# Patient Record
Sex: Female | Born: 1970 | Race: Black or African American | Hispanic: No | Smoking: Never smoker
Health system: Southern US, Community
[De-identification: ages and names within clinical notes are randomized; demographics above are authoritative.]

## PROBLEM LIST (undated history)

## (undated) DIAGNOSIS — K219 Gastro-esophageal reflux disease without esophagitis: Secondary | ICD-10-CM

## (undated) DIAGNOSIS — I10 Essential (primary) hypertension: Secondary | ICD-10-CM

## (undated) HISTORY — DX: Essential (primary) hypertension: I10

## (undated) HISTORY — PX: TUBAL LIGATION: SHX77

---

## 2007-12-21 ENCOUNTER — Emergency Department (HOSPITAL_COMMUNITY): Admission: EM | Admit: 2007-12-21 | Discharge: 2007-12-21 | Payer: Self-pay | Admitting: Emergency Medicine

## 2009-03-30 ENCOUNTER — Emergency Department (HOSPITAL_COMMUNITY): Admission: EM | Admit: 2009-03-30 | Discharge: 2009-03-30 | Payer: Self-pay | Admitting: Emergency Medicine

## 2010-04-26 LAB — BASIC METABOLIC PANEL
CO2: 26 mEq/L (ref 19–32)
Calcium: 9 mg/dL (ref 8.4–10.5)
Chloride: 107 mEq/L (ref 96–112)
Glucose, Bld: 95 mg/dL (ref 70–99)
Sodium: 139 mEq/L (ref 135–145)

## 2010-04-26 LAB — URINALYSIS, ROUTINE W REFLEX MICROSCOPIC
Bilirubin Urine: NEGATIVE
Glucose, UA: NEGATIVE mg/dL
Hgb urine dipstick: NEGATIVE
Specific Gravity, Urine: 1.025 (ref 1.005–1.030)
Urobilinogen, UA: 0.2 mg/dL (ref 0.0–1.0)

## 2010-11-08 LAB — URINALYSIS, ROUTINE W REFLEX MICROSCOPIC
Hgb urine dipstick: NEGATIVE
Nitrite: NEGATIVE
Specific Gravity, Urine: >1.03 — ABNORMAL HIGH
Urobilinogen, UA: 0.2

## 2010-11-08 LAB — WET PREP, GENITAL

## 2010-11-08 LAB — PREGNANCY, URINE: Preg Test, Ur: NEGATIVE

## 2010-11-08 LAB — GC/CHLAMYDIA PROBE AMP, GENITAL: Chlamydia, DNA Probe: NEGATIVE

## 2012-03-15 ENCOUNTER — Emergency Department (HOSPITAL_COMMUNITY)
Admission: EM | Admit: 2012-03-15 | Discharge: 2012-03-15 | Disposition: A | Discharge status: Home / Self Care | Payer: BC Managed Care – PPO | Attending: Emergency Medicine | Admitting: Emergency Medicine

## 2012-03-15 ENCOUNTER — Encounter (HOSPITAL_COMMUNITY): Payer: Self-pay

## 2012-03-15 DIAGNOSIS — K089 Disorder of teeth and supporting structures, unspecified: Secondary | ICD-10-CM | POA: Insufficient documentation

## 2012-03-15 DIAGNOSIS — K0889 Other specified disorders of teeth and supporting structures: Secondary | ICD-10-CM

## 2012-03-15 DIAGNOSIS — Z8719 Personal history of other diseases of the digestive system: Secondary | ICD-10-CM | POA: Insufficient documentation

## 2012-03-15 HISTORY — DX: Gastro-esophageal reflux disease without esophagitis: K21.9

## 2012-03-15 MED ORDER — HYDROCODONE-ACETAMINOPHEN 5-325 MG PO TABS: ORAL_TABLET | ORAL | Status: DC

## 2012-03-15 MED ORDER — MELOXICAM 7.5 MG PO TABS: ORAL_TABLET | ORAL | Status: DC

## 2012-03-15 MED ORDER — HYDROCODONE-ACETAMINOPHEN 5-325 MG PO TABS
2.0000 | ORAL_TABLET | Freq: Once | ORAL | Status: AC
  Administered 2012-03-15: 1 via ORAL
  Filled 2012-03-15: qty 2

## 2012-03-15 MED ORDER — AMOXICILLIN 500 MG PO CAPS: 500.0000 mg | ORAL_CAPSULE | Freq: Three times a day (TID) | ORAL | Status: DC

## 2012-03-15 MED ORDER — AMOXICILLIN 250 MG PO CAPS
500.0000 mg | ORAL_CAPSULE | Freq: Once | ORAL | Status: AC
  Administered 2012-03-15: 500 mg via ORAL
  Filled 2012-03-15: qty 2

## 2012-03-15 MED ORDER — IBUPROFEN 800 MG PO TABS
800.0000 mg | ORAL_TABLET | Freq: Once | ORAL | Status: AC
  Administered 2012-03-15: 800 mg via ORAL
  Filled 2012-03-15: qty 1

## 2012-03-15 NOTE — ED Notes (Signed)
Multiple toothaches for 2 days. Right side of mouth

## 2012-03-15 NOTE — ED Notes (Signed)
Pt c/o dental pain that starts at her front two upper teeth and two upper back teeth, pain radiates around right ear and down right shoulder area, pt states that the teeth started bothering her about a year ago, pain is worse with cold liquids, can be made better by "swishing" with white vinegar but pain always returns, became worse two days ago

## 2012-03-15 NOTE — ED Provider Notes (Signed)
Medical screening examination/treatment/procedure(s) were performed by non-physician practitioner and as supervising physician I was immediately available for consultation/collaboration.   Carleene Cooper III, MD 03/15/12 [phone]

## 2012-03-15 NOTE — ED Provider Notes (Signed)
History     CSN: [id number]  Arrival date & time 03/15/12  1156   First MD Initiated Contact with Patient 03/15/12 1218      Chief Complaint  Patient presents with  . Dental Pain    (Consider location/radiation/quality/duration/timing/severity/associated sxs/prior treatment) Patient is a 42 y.o. female presenting with tooth pain. The history is provided by the patient.  Dental PainThe primary symptoms include mouth pain. Primary symptoms do not include shortness of breath or cough. The symptoms began 2 days ago. The symptoms are worsening. The symptoms occur constantly.  Additional symptoms include: gum swelling and jaw pain. Additional symptoms do not include: nosebleeds. Associated symptoms comments: Neck pain. Occasional SOB.. Medical issues do not include: alcohol problem and smoking.    Past Medical History  Diagnosis Date  . Acid reflux     Past Surgical History  Procedure Laterality Date  . Tubal ligation    . Cesarean section      No family history on file.  History  Substance Use Topics  . Smoking status: Never Smoker   . Smokeless tobacco: Not on file  . Alcohol Use: No    OB History   Grav Para Term Preterm Abortions TAB SAB Ect Mult Living                  Review of Systems  Constitutional: Negative for activity change.       All ROS Neg except as noted in HPI  HENT: Positive for dental problem. Negative for nosebleeds and neck pain.   Eyes: Negative for photophobia and discharge.  Respiratory: Negative for cough, shortness of breath and wheezing.   Cardiovascular: Negative for chest pain and palpitations.  Gastrointestinal: Negative for abdominal pain and blood in stool.  Genitourinary: Negative for dysuria, frequency and hematuria.  Musculoskeletal: Negative for back pain and arthralgias.  Skin: Negative.   Neurological: Negative for dizziness, seizures and speech difficulty.  Psychiatric/Behavioral: Negative for hallucinations and confusion.     Allergies  Review of patient's allergies indicates not on file.  Home Medications  No current outpatient prescriptions on file.  BP 156/73  Pulse 75  Temp(Src) 98.3 F (36.8 C) (Oral)  Ht 5\' 9"  (1.753 m)  Wt 173 lb (78.472 kg)  BMI 25.54 kg/m2  SpO2 100%  LMP 03/05/2012  Physical Exam  Nursing note and vitals reviewed. Constitutional: She is oriented to person, place, and time. She appears well-developed and well-nourished.  Non-toxic appearance.  HENT:  Head: Normocephalic.  Right Ear: Tympanic membrane and external ear normal.  Left Ear: Tympanic membrane and external ear normal.  Pain to palpation of the area of the right upper canine, right upper 2nd molar, and right lower 3rd molar. No abscess noted. Airway patent. No swelling under the tongue.  Eyes: EOM and lids are normal. Pupils are equal, round, and reactive to light.  Neck: Normal range of motion. Neck supple. Carotid bruit is not present.  Cardiovascular: Normal rate, regular rhythm, normal heart sounds, intact distal pulses and normal pulses.   Pulmonary/Chest: Breath sounds normal. No respiratory distress.  Abdominal: Soft. Bowel sounds are normal. There is no tenderness. There is no guarding.  Musculoskeletal: Normal range of motion.  Lymphadenopathy:       Head (right side): No submandibular adenopathy present.       Head (left side): No submandibular adenopathy present.    She has no cervical adenopathy.  Neurological: She is alert and oriented to person, place, and time. She has  normal strength. No cranial nerve deficit or sensory deficit.  Skin: Skin is warm and dry.  Psychiatric: She has a normal mood and affect. Her speech is normal.    ED Course  Procedures (including critical care time)  Labs Reviewed - No data to display No results found.  EKG 12:39  -  Rate 76. Rhythm - normal sinus with sinus arrhythmia. PR- normal. Biphasic P with possible LAE. Axis - normal. QRS - normal. No conduction  problems. ST normal. No STEMI. No old EDG for comparison. No diagnosis found.    MDM  I have reviewed nursing notes, vital signs, and all appropriate lab and imaging results for this patient. Pt presents with 2 days of increasing pain related to toothache. Plan Rx for amoxil, mobic, and norco. Pt to see a dentist as soon as possible.       Kathie Dike, PA 03/15/12 1231  Kathie Dike, [place] 03/15/12 1300

## 2012-07-28 ENCOUNTER — Encounter (HOSPITAL_COMMUNITY): Payer: Self-pay | Admitting: *Deleted

## 2012-07-28 ENCOUNTER — Emergency Department (HOSPITAL_COMMUNITY)
Admission: EM | Admit: 2012-07-28 | Discharge: 2012-07-28 | Disposition: A | Discharge status: Home / Self Care | Payer: BC Managed Care – PPO | Attending: Emergency Medicine | Admitting: Emergency Medicine

## 2012-07-28 ENCOUNTER — Emergency Department (HOSPITAL_COMMUNITY): Payer: BC Managed Care – PPO

## 2012-07-28 DIAGNOSIS — R05 Cough: Secondary | ICD-10-CM | POA: Insufficient documentation

## 2012-07-28 DIAGNOSIS — R059 Cough, unspecified: Secondary | ICD-10-CM | POA: Insufficient documentation

## 2012-07-28 DIAGNOSIS — R0789 Other chest pain: Secondary | ICD-10-CM

## 2012-07-28 DIAGNOSIS — Z79899 Other long term (current) drug therapy: Secondary | ICD-10-CM | POA: Insufficient documentation

## 2012-07-28 DIAGNOSIS — M549 Dorsalgia, unspecified: Secondary | ICD-10-CM | POA: Insufficient documentation

## 2012-07-28 DIAGNOSIS — Z8719 Personal history of other diseases of the digestive system: Secondary | ICD-10-CM | POA: Insufficient documentation

## 2012-07-28 DIAGNOSIS — R071 Chest pain on breathing: Secondary | ICD-10-CM | POA: Insufficient documentation

## 2012-07-28 MED ORDER — CYCLOBENZAPRINE HCL 10 MG PO TABS: 10.0000 mg | ORAL_TABLET | Freq: Two times a day (BID) | ORAL | Status: DC | PRN

## 2012-07-28 MED ORDER — KETOROLAC TROMETHAMINE 60 MG/2ML IM SOLN
60.0000 mg | Freq: Once | INTRAMUSCULAR | Status: AC
  Administered 2012-07-28: 60 mg via INTRAMUSCULAR
  Filled 2012-07-28: qty 2

## 2012-07-28 MED ORDER — NAPROXEN 500 MG PO TABS: 500.0000 mg | ORAL_TABLET | Freq: Two times a day (BID) | ORAL | Status: DC

## 2012-07-28 NOTE — ED Notes (Signed)
Pt and daughter given d/c instructions verbalized understanding of all, scripts given. Ambulated to lobby unaided.

## 2012-07-28 NOTE — ED Notes (Signed)
Pain rt breast and into back  Since 6/18, No injury,Increased pain with movement and deep breath.

## 2012-07-28 NOTE — ED Provider Notes (Signed)
History    CSN: [id number] Arrival date & time 07/28/12  1615  First MD Initiated Contact with Patient 07/28/12 1718     No chief complaint on file.  (Consider location/radiation/quality/duration/timing/severity/associated sxs/prior Treatment) Patient is a 42 y.o. female presenting with chest pain. The history is provided by the patient.  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) Duration:  5 days Timing:  Intermittent Progression:  Worsening Chronicity:  New Context: movement   Relieved by:  Nothing Worsened by:  Coughing, deep breathing and movement Ineffective treatments:  Aspirin Associated symptoms: back pain   Associated symptoms: no anorexia, no anxiety, no cough, no dizziness, no fever, no headache, no lower extremity edema, no nausea, no numbness, no shortness of breath, no syncope, not vomiting and no weakness   Risk factors: no coronary artery disease, no diabetes mellitus, no high cholesterol, no hypertension, no prior DVT/PE, no smoking and no surgery    Carolin H Lymon is a 42 y.o. female who presents to the ED with right breast pain. The pain started 5 days ago. She describes the pain as sharp shooting that increases with movement or taking a deep breath. She does a lot of lifting at work and the pain started while at work. 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 The pain radiates from her right chest to her upper back.   Past Medical History  Diagnosis Date  . Acid reflux    Past Surgical History  Procedure Laterality Date  . Tubal ligation    . Cesarean section     History reviewed. No pertinent family history. History  Substance Use Topics  . Smoking status: Never Smoker   . Smokeless tobacco: Not on file  . Alcohol Use: No   OB History   Grav Para  Term Preterm Abortions TAB SAB Ect Mult Living                 Review of Systems  Constitutional: Negative for fever and chills.  HENT: Negative for sore throat and neck pain.   Respiratory: Negative for cough and shortness of breath.   Cardiovascular: Positive for chest pain. Negative for syncope.  Gastrointestinal: Negative for nausea, vomiting and anorexia.  Musculoskeletal: Positive for back pain.  Skin: Negative for wound.  Neurological: Negative for dizziness, weakness, numbness and headaches.    Allergies  Review of patient's allergies indicates no known allergies.  Home Medications   Current Outpatient Rx  Name  Route  Sig  Dispense  Refill  . amoxicillin (AMOXIL) 500 MG capsule   Oral   Take 1 capsule (500 mg total) by mouth 3 (three) times daily.   21 capsule   0   . HYDROcodone-acetaminophen (NORCO/VICODIN) 5-325 MG per tablet      1 or 2 po q4h prn pain   20 tablet   0   . meloxicam (MOBIC) 7.5 MG tablet      1 po bid with food   12 tablet   0    BP 140/58  Pulse 78  Temp(Src) 98.1 F (36.7 C) (Oral)  Resp 18  Ht 5\' 9"  (1.753 m)  Wt 171 lb (77.565 kg)  BMI 25.24 kg/m2  SpO2 100%  LMP 07/13/2012 Physical Exam  Nursing note and  vitals reviewed. Constitutional: She is oriented to person, place, and time. She appears well-developed and well-nourished. No distress.  HENT:  Head: Normocephalic.  Eyes: Conjunctivae and EOM are normal.  Neck: Trachea normal and normal range of motion. Neck supple. No JVD present.  Cardiovascular: Normal rate, regular rhythm and normal heart sounds.   Pulmonary/Chest: Effort normal and breath sounds normal. She exhibits tenderness. She exhibits no mass. Right breast exhibits no inverted nipple, no mass and no nipple discharge.    There is pain with palpation of the right chest wall and pain with palpation and range of motion of the right thoracic area.   Abdominal: Soft. There is no tenderness.  Musculoskeletal:        Thoracic back: She exhibits decreased range of motion, tenderness and spasm.       Back:  Radial pulse strong, adequate circulation.  Neurological: She is alert and oriented to person, place, and time. No cranial nerve deficit.  Skin: Skin is warm and dry.  Psychiatric: She has a normal mood and affect. Her behavior is normal.    ED Course: patient improved with Toradol IM. She states it doesn't hurt nearly as bad when she takes a deep breath or moves.   Procedures   Dg Chest 2 View  07/28/2012   *RADIOLOGY REPORT*  Clinical Data: Low back and right chest pain worse with inspiration for 5 days.  CHEST - 2 VIEW  Comparison: Radiographs 03/30/2009.  Findings: The heart size and mediastinal contours are normal. The lungs are clear. There is no pleural effusion or pneumothorax. No acute osseous findings are identified.  A narrow AP diameter of the chest is unchanged.  IMPRESSION: Stable examination.  No acute cardiopulmonary process.   Original Report Authenticated By: Carey Bullocks, M.D.    MDM  42 y.o. female with chest wall pain and thoracic pain. Will treat pain and inflammation and have her follow up with her PCP. Patient stable for discharge without any cardiac complaints or symptoms.  Discussed with the patient x-ray, clinical findings and all questioned fully answered. She will return if any problems arise.    Medication List    TAKE these medications       cyclobenzaprine 10 MG tablet  Commonly known as:  FLEXERIL  Take 1 tablet (10 mg total) by mouth 2 (two) times daily as needed for muscle spasms.     naproxen 500 MG tablet  Commonly known as:  NAPROSYN  Take 1 tablet (500 mg total) by mouth 2 (two) times daily.      ASK your doctor about these medications       aspirin 325 MG tablet  Take 325 mg by mouth daily as needed for pain.         Janne Napoleon, [place] 07/28/12 2329

## 2012-07-30 NOTE — ED Provider Notes (Signed)
Medical screening examination/treatment/procedure(s) were performed by non-physician practitioner and as supervising physician I was immediately available for consultation/collaboration.     Christopher J. Pollina, MD 07/30/12 1506 

## 2013-06-03 ENCOUNTER — Emergency Department (HOSPITAL_COMMUNITY)
Admission: EM | Admit: 2013-06-03 | Discharge: 2013-06-03 | Disposition: A | Discharge status: Home / Self Care | Payer: BC Managed Care – PPO | Attending: Emergency Medicine | Admitting: Emergency Medicine

## 2013-06-03 ENCOUNTER — Emergency Department (HOSPITAL_COMMUNITY): Payer: BC Managed Care – PPO

## 2013-06-03 DIAGNOSIS — Y9389 Activity, other specified: Secondary | ICD-10-CM | POA: Insufficient documentation

## 2013-06-03 DIAGNOSIS — Y92009 Unspecified place in unspecified non-institutional (private) residence as the place of occurrence of the external cause: Secondary | ICD-10-CM | POA: Insufficient documentation

## 2013-06-03 DIAGNOSIS — S42409A Unspecified fracture of lower end of unspecified humerus, initial encounter for closed fracture: Secondary | ICD-10-CM | POA: Insufficient documentation

## 2013-06-03 DIAGNOSIS — W06XXXA Fall from bed, initial encounter: Secondary | ICD-10-CM | POA: Insufficient documentation

## 2013-06-03 DIAGNOSIS — Z8719 Personal history of other diseases of the digestive system: Secondary | ICD-10-CM | POA: Insufficient documentation

## 2013-06-03 MED ORDER — HYDROCODONE-ACETAMINOPHEN 5-325 MG PO TABS: 2.0000 | ORAL_TABLET | ORAL | Status: DC | PRN

## 2013-06-03 MED ORDER — NAPROXEN 500 MG PO TABS: 500.0000 mg | ORAL_TABLET | Freq: Two times a day (BID) | ORAL | Status: DC

## 2013-06-03 MED ORDER — HYDROMORPHONE HCL PF 1 MG/ML IJ SOLN
1.0000 mg | Freq: Once | INTRAMUSCULAR | Status: AC
  Administered 2013-06-03: 1 mg via INTRAMUSCULAR
  Filled 2013-06-03: qty 1

## 2013-06-03 NOTE — ED Notes (Signed)
Pt states she had a bad dream, fell out of bed onto her right arm, has obvious deformity and swelling to right elbow, given fentanyl 50 mcg IM per ems enroute.

## 2013-06-03 NOTE — Discharge Instructions (Signed)
Dr. Romeo AppleHarrison wants to see you on Thursday - see information above for his office - call in the morning to arrange the follow up appointment.  Hydrocodone with tylenol for pain, naprosyn for mild pain.  Please call your doctor for a followup appointment within 24-48 hours. When you talk to your doctor please let them know that you were seen in the emergency department and have them acquire all of your records so that they can discuss the findings with you and formulate a treatment plan to fully care for your new and ongoing problems.

## 2013-06-03 NOTE — ED Notes (Signed)
Posterior long arm splint applied to right arm by this nurse; assisted by Nilda SimmerK Belton, RN.

## 2013-06-03 NOTE — ED Notes (Signed)
Discharge instructions and prescriptions given and reviewed with patient.  Patient verbalized understanding to take medications as directed, sedating effects of Vicodin and to call Dr. Mort SawyersHarrison's office today for a follow up appointment.  Patient discharged home in stable condition.

## 2013-06-03 NOTE — ED Provider Notes (Signed)
CSN: [id number]     Arrival date & time 06/03/13  0135 History   First MD Initiated Contact with Patient 06/03/13 0139     Chief Complaint  Patient presents with  . Fall  . Arm Injury     (Consider location/radiation/quality/duration/timing/severity/associated sxs/prior Treatment) HPI Comments: 43 year old female presents after falling at home. She states that she was trying to reach for the light switch while in bed, fell out of the bed landing on her right upper arm and feeling acute onset of pain which is severe, persistent and worse with range of motion. She denies numbness or weakness to the hand. No other injuries including head, neck, lower extremities, chest, abdomen or back  Patient is a 43 y.o. female presenting with fall and arm injury. The history is provided by the patient and the EMS personnel.  Fall  Arm Injury   Past Medical History  Diagnosis Date  . Acid reflux    Past Surgical History  Procedure Laterality Date  . Tubal ligation    . Cesarean section     No family history on file. History  Substance Use Topics  . Smoking status: Never Smoker   . Smokeless tobacco: Not on file  . Alcohol Use: No   OB History   Grav Para Term Preterm Abortions TAB SAB Ect Mult Living                 Review of Systems  All other systems reviewed and are negative.     Allergies  Bee venom  Home Medications   Prior to Admission medications   Medication Sig Start Date End Date Taking? Authorizing Provider  aspirin 325 MG tablet Take 325 mg by mouth daily as needed for pain.    Historical Provider, MD  cyclobenzaprine (FLEXERIL) 10 MG tablet Take 1 tablet (10 mg total) by mouth 2 (two) times daily as needed for muscle spasms. 07/28/12   Hope Orlene OchM Neese, NP  naproxen (NAPROSYN) 500 MG tablet Take 1 tablet (500 mg total) by mouth 2 (two) times daily. 07/28/12   Hope Orlene OchM Neese, NP   BP 143/86  Pulse 84  Temp(Src) 98.8 F (37.1 C) (Oral)  Resp 20  Ht 5\' 9"  (1.753 m)  Wt  185 lb (83.915 kg)  BMI 27.31 kg/m2  SpO2 100%  LMP 05/10/2013 Physical Exam  Nursing note and vitals reviewed. Constitutional: She appears well-developed and well-nourished. No distress.  HENT:  Head: Normocephalic and atraumatic.  Mouth/Throat: Oropharynx is clear and moist. No oropharyngeal exudate.  Eyes: Conjunctivae and EOM are normal. Pupils are equal, round, and reactive to light. Right eye exhibits no discharge. Left eye exhibits no discharge. No scleral icterus.  Neck: Normal range of motion. Neck supple. No JVD present. No thyromegaly present.  Cardiovascular: Normal rate, regular rhythm, normal heart sounds and intact distal pulses.  Exam reveals no gallop and no friction rub.   No murmur heard. Pulmonary/Chest: Effort normal and breath sounds normal. No respiratory distress. She has no wheezes. She has no rales.  Abdominal: Soft. Bowel sounds are normal. She exhibits no distension and no mass. There is no tenderness.  Musculoskeletal: She exhibits tenderness. She exhibits no edema.  Mid upper extremity on the right proximal to the elbow with deformity and swelling, normal range of motion of the right wrist, normal grip on the right, normal exam of the right shoulder, decreased range of motion of the right shoulder and right elbow secondary to upper arm pain  Lymphadenopathy:  She has no cervical adenopathy.  Neurological: She is alert. Coordination normal.  Normal sensation and motor to the right upper extremity  Skin: Skin is warm and dry. No rash noted. No erythema.  Psychiatric: She has a normal mood and affect. Her behavior is normal.    ED Course  Procedures (including critical care time) Labs Review Labs Reviewed - No data to display  Imaging Review Dg Elbow Complete Right  06/03/2013   CLINICAL DATA:  Status post fall out of bed, landing on right elbow. Severe right elbow pain and distal humeral deformity.  EXAM: RIGHT ELBOW - COMPLETE 3+ VIEW  COMPARISON:   None.  FINDINGS: There is a mildly comminuted and displaced fracture involving the distal humeral diaphysis, with significant varying angulation. There also appears to be a small osseous fragment arising at the coronoid process, raising concern for an additional acute fracture. Surrounding soft tissue swelling is noted. Evaluation for an elbow joint effusion is limited given limitations in positioning.  IMPRESSION: 1. Mildly comminuted and displaced fracture involving the distal humeral diaphysis, with significant varying angulation. 2. Small osseous fragment arising at the coronoid process, raising concern for an additional acute fracture.   Electronically Signed   By: Roanna RaiderJeffery  Chang M.D.   On: 06/03/2013 02:19   Dg Humerus Right  06/03/2013   CLINICAL DATA:  Status post fall out of bed. Landed on right elbow, with severe right distal arm pain, swelling and deformity.  EXAM: RIGHT HUMERUS - 2+ VIEW  COMPARISON:  None.  FINDINGS: There is a significantly angulated and mildly comminuted fracture involving the distal humeral diaphysis, with varying angulation depending on positioning. Slight shortening is noted at the fracture site. The right humeral head remains seated at the glenoid fossa.  There is suggestion of a small displaced osseous fragment at the coronoid process, which may reflect an acute fracture.  IMPRESSION: 1. Significantly attenuated and mildly comminuted fracture involving the distal humeral diaphysis, with varying angulation and slight shortening. 2. Suggestion of a small displaced fragment at the coronoid process, which may reflect an acute fracture.   Electronically Signed   By: Roanna RaiderJeffery  Chang M.D.   On: 06/03/2013 02:16      MDM   Final diagnoses:  Humerus distal fracture    Deformity of the right upper extremity consistent with likely distal humerus fracture, normal neurologic exam, normal pulses, imaging, immobilization, pain medication.  Imaging ordered, I have personally seen  and interpreted the x-ray of the humerus and the elbow of the right upper extremity. There is a fracture through the distal humerus of the diaphysis, glenoid joint normal, elbow joint normal, discussed with Dr. Romeo AppleHarrison who will see the patient in the office within 36 hours, pain medications given with improvement, the patient will be placed in a posterior splint with a sling and followup in the office.   Meds given in ED:  Medications  HYDROmorphone (DILAUDID) injection 1 mg (1 mg Intramuscular Given 06/03/13 0145)    New Prescriptions   HYDROCODONE-ACETAMINOPHEN (NORCO/VICODIN) 5-325 MG PER TABLET    Take 2 tablets by mouth every 4 (four) hours as needed.   NAPROXEN (NAPROSYN) 500 MG TABLET    Take 1 tablet (500 mg total) by mouth 2 (two) times daily with a meal.      Vida RollerBrian D Reeva Davern, MD 06/03/13 [phone]

## 2013-06-04 ENCOUNTER — Encounter: Payer: Self-pay | Admitting: Orthopedic Surgery

## 2013-06-04 ENCOUNTER — Ambulatory Visit (INDEPENDENT_AMBULATORY_CARE_PROVIDER_SITE_OTHER): Payer: BC Managed Care – PPO | Admitting: Orthopedic Surgery

## 2013-06-04 VITALS — BP 130/55 | Ht 69.0 in | Wt 187.0 lb

## 2013-06-04 DIAGNOSIS — S42411A Displaced simple supracondylar fracture without intercondylar fracture of right humerus, initial encounter for closed fracture: Secondary | ICD-10-CM | POA: Insufficient documentation

## 2013-06-04 DIAGNOSIS — S42413A Displaced simple supracondylar fracture without intercondylar fracture of unspecified humerus, initial encounter for closed fracture: Secondary | ICD-10-CM

## 2013-06-04 NOTE — Patient Instructions (Addendum)
Surgery on right arm   Humerus fracture  Surgical risks include nerve injury, bleeding, infection.  You will lose some elbow motion   Humerus Fracture, Treated with Open Reduction You have a fracture (break in bone) of your humerus. This is the large bone in your upper arm. These fractures are easily diagnosed with x-rays. TREATMENT  Simple fractures that will heal without disability are treated with simple immobilization. This is often followed with early range of motion exercises to keep the shoulder from becoming frozen (stuck in one position). Your caregiver feels you have an unstable fracture. Unstable fractures are treated with an open reduction (operation) and internal fixation. A screw is put into the fracture to hold the bone pieces in position while they heal. This is done to obtain the best possible result for shoulder function. These fixation devices are often left in place after healing. They generally do not cause problems and you usually will not know they are there. RELATED COMPLICATIONS The most common complication of upper arm fractures is adhesive capsulitis. This means the shoulder is frozen or difficult to move. Other complications include infection, non-union or malunion of the bones. This means the bones do not heal in the correct position or direction. BEFORE AND AFTER YOUR SURGERY Prior to surgery, an IV (intravenous line connected to your vein for giving fluids) may be started. You will be given an anesthetic (medications and gas to make you sleep). After surgery, you will be taken to the recovery area where a nurse will watch your progress. You may have a catheter (a long, narrow, hollow tube) in your bladder that helps you pass your water. Once you're awake, stable, and taking fluids well, you'll be returned to your room. You will receive physical therapy and other care until you are doing well and your caregiver feels it is safe for you to be transferred either to home or to  an extended care facility. HOME CARE INSTRUCTIONS  You may resume normal diet and activities as directed or allowed.  Change dressings if necessary or as instructed.  Only take over-the-counter or prescription medicines for pain, discomfort, or fever as directed by your caregiver. SEEK IMMEDIATE MEDICAL CARE IF:   There is redness, swelling, or increasing pain in the wound.  There is pus coming from wound.  An unexplained oral temperature above 102 F (38.9 C) develops, or as your caregiver suggests.  A bad smell is coming from the wound or dressing.  The edges of the wound are not staying together after sutures or staples have been removed. MAKE SURE YOU:   Understand these instructions.  Will watch your condition.  Will get help right away if you are not doing well or get worse. Document Released: 07/18/2000 Document Revised: 04/16/2011 Document Reviewed: 09/12/2007 [facility] Patient Information 2014 [employer].

## 2013-06-04 NOTE — Progress Notes (Signed)
Patient ID: Courtney Shields, female   DOB: 07-11-1970, 43 y.o.   MRN: [id number]  Chief Complaint  Patient presents with  . Arm Pain    Fractured right humerus d/t injury 06/03/13    HISTORY: -year-old female fell on April 29 injuring her right distal humerus sustaining a fracture and she fell out of bed. She complains of swelling pain and throbbing which is rated 10. She had x-rays she is on naproxen and hydrocodone. She denies numbness or  Family history diabetes hypertension stroke cancer depression disease  Review of systems asymmetric heart murmur. Was normal.  Lower extremity exam  Ambulation is normal.  The right and left lower extremity:  Inspection and palpation revealed no tenderness or abnormality in alignment in the lower extremities. Range of motion is full.  Strength is grade 5.  All joints are stable.  The left upper extremity:   Inspection and palpation revealed no abnormalities in the upper extremities.   Range of motion is full without contracture.  Motor exam is normal with grade 5 strength.  The joints are fully reduced without subluxation.  There is no atrophy or tremor and muscle tone is normal.  All joints are stable.  Right arm is swollen over the distal humerus and elbow. She has intact wrist extension as well as thumb extension is normal lites are soft touch. Pulsatile normal. Skin intact. No lymphadenopathy. No pathologic reflexes. Normal balance.  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 This   result in a functional deficit which is acceptable.  Open treatment internal fixation right humerus.

## 2013-06-05 ENCOUNTER — Encounter (HOSPITAL_COMMUNITY): Payer: Self-pay | Admitting: Pharmacy Technician

## 2013-06-08 ENCOUNTER — Telehealth: Payer: Self-pay | Admitting: Orthopedic Surgery

## 2013-06-08 MED FILL — Hydrocodone-Acetaminophen Tab 5-325 MG: ORAL | Qty: 6 | Status: AC

## 2013-06-08 NOTE — Telephone Encounter (Addendum)
Regarding out-patient surgery scheduled at [facility] on 06/12/13, CPT [phone], ICD9 code 812.41, contacted [employer] at ph# [phone]; per Renata D, no pre-authorization is required for out-patient procedures, however, subject to medical review at time claim is received.  REF# [id number] **Additional codes for this date of service, 29881/29880.

## 2013-06-09 ENCOUNTER — Encounter (HOSPITAL_COMMUNITY): Payer: Self-pay

## 2013-06-09 ENCOUNTER — Encounter (HOSPITAL_COMMUNITY)
Admission: RE | Admit: 2013-06-09 | Discharge: 2013-06-09 | Disposition: A | Discharge status: Home / Self Care | Payer: BC Managed Care – PPO | Source: Ambulatory Visit | Attending: Orthopedic Surgery | Admitting: Orthopedic Surgery

## 2013-06-09 LAB — BASIC METABOLIC PANEL
BUN: 11 mg/dL (ref 6–23)
CO2: 26 mEq/L (ref 19–32)
Calcium: 8.9 mg/dL (ref 8.4–10.5)
Chloride: 102 mEq/L (ref 96–112)
Creatinine, Ser: 0.85 mg/dL (ref 0.50–1.10)
GFR calc Af Amer: >90 mL/min (ref >90)
GFR, EST NON AFRICAN AMERICAN: 84 mL/min — AB (ref >90)
Glucose, Bld: 83 mg/dL (ref 70–99)
Potassium: 4.1 mEq/L (ref 3.7–5.3)
SODIUM: 139 meq/L (ref 137–147)

## 2013-06-09 LAB — CBC
HCT: 29.1 % — ABNORMAL LOW (ref 36.0–46.0)
Hemoglobin: 8.7 g/dL — ABNORMAL LOW (ref 12.0–15.0)
MCH: 23.1 pg — ABNORMAL LOW (ref 26.0–34.0)
MCHC: 29.9 g/dL — AB (ref 30.0–36.0)
MCV: 77.4 fL — ABNORMAL LOW (ref 78.0–100.0)
Platelets: 357 10*3/uL (ref 150–400)
RBC: 3.76 MIL/uL — ABNORMAL LOW (ref 3.87–5.11)
RDW: 18.6 % — ABNORMAL HIGH (ref 11.5–15.5)
WBC: 5.7 10*3/uL (ref 4.0–10.5)

## 2013-06-09 NOTE — Patient Instructions (Addendum)
Your procedure is scheduled on: 06/12/2013  [facility]15     AM.  Call this number if you have problems the morning of surgery: [phone]   Remember:   Do not drink or eat food:After Midnight.  :  Take these medicines the morning of surgery with A SIP OF WATER: Norco   Do not wear jewelry, make-up or nail polish.  Do not wear lotions, powders, or perfumes. You may wear deodorant.  Do not shave 48 hours prior to surgery. Men may shave face and neck.  Do not bring valuables to the hospital.  Contacts, dentures or bridgework may not be worn into surgery.  Leave suitcase in the car. After surgery it may be brought to your room.  For patients admitted to the hospital, checkout time is 11:00 AM the day of discharge.   Patients discharged the day of surgery will not be allowed to drive home.    Special Instructions: Shower using CHG night before surgery and shower the day of surgery use CHG.  Use special wash - you have one bottle of CHG for all showers.  You should use approximately 1/2 of the bottle for each shower.   Please read over the following fact sheets that you were given: Pain Booklet, MRSA Information, Surgical Site Infection Prevention and Care and Recovery After Surgery  Humerus Fracture, Treated with Open Reduction Care After Please read the instructions outlined below and refer to this sheet in the next few weeks. These discharge instructions provide you with general information on caring for yourself after you leave the hospital or out patient surgery center. Your caregiver may also give you specific instructions. If you have any problems or questions after discharge, please call your caregiver. HOME CARE INSTRUCTIONS   It is normal to have pain for a couple of weeks following surgery. See your caregiver if this seems to be getting worse rather than better.  Only take over-the-counter or prescription medicines for pain, discomfort, or fever as directed by your  caregiver.  Use showers for bathing, until seen or as instructed.  Change dressings as directed.  You may resume normal diet and activities as directed or allowed.  Use you sling or splint and avoid lifting until you are instructed otherwise.  Make an appointment to see your caregiver for stitch or staple removal when instructed.  You may use ice on your incision for 15-20 minutes, 03-04 times per day for the first 2 days. SEEK MEDICAL CARE IF:   You have increased bleeding from your wound.  You see redness, swelling, or have increasing pain in your wound.  You have pus coming from your wound.  You develop an unexplained temperature over 102 F (38.9 C).  You have a foul smell coming from the wound or dressing.  You develop lightheadedness or feel faint.  You have numbness, tingling or weakness in your hand that was not present immediately after surgery. SEEK IMMEDIATE MEDICAL CARE IF:   You develop a rash.  You have difficulty breathing.  You develop any reaction or side effects to medications given.  You have fever greater than 102 F (38.9 C) that does not respond to medicine. MAKE SURE YOU:   Understand these instructions.  Will watch your condition.  Will get help right away if you are not doing well or get worse. Document Released: 08/11/2004 Document Revised: 04/16/2011 Document Reviewed: 05/13/2008 [facility] Patient Information 2014 [employer], [place]. PATIENT INSTRUCTIONS POST-ANESTHESIA  IMMEDIATELY FOLLOWING SURGERY:  Do not drive or operate machinery for the first twenty four hours after surgery.  Do not make any important decisions for twenty four hours after surgery or while taking narcotic pain medications or sedatives.  If you develop intractable nausea and vomiting or a severe headache please notify your doctor immediately.  FOLLOW-UP:  Please make an appointment with your surgeon as instructed. You do not need to follow up with anesthesia unless  specifically instructed to do so.  WOUND CARE INSTRUCTIONS (if applicable):  Keep a dry clean dressing on the anesthesia/puncture wound site if there is drainage.  Once the wound has quit draining you may leave it open to air.  Generally you should leave the bandage intact for twenty four hours unless there is drainage.  If the epidural site drains for more than 36-48 hours please call the anesthesia department.  QUESTIONS?:  Please feel free to call your physician or the hospital operator if you have any questions, and they will be happy to assist you.      Incentive Spirometer An incentive spirometer is a tool that can help keep your lungs clear and active. This tool measures how well you are filling your lungs with each breath. Taking long deep breaths may help reverse or decrease the chance of developing breathing (pulmonary) problems (especially infection) following:  Surgery of the chest or abdomen.  Surgery if you have a history of smoking or a lung problem.  A long period of time when you are unable to move or be active. BEFORE THE PROCEDURE   If the spirometer includes an indictor to show your best effort, your nurse or respiratory therapist will set it to a desired goal.  If possible, sit up straight or lean slightly forward. Try not to slouch.  Hold the incentive spirometer in an upright position. INSTRUCTIONS FOR USE  1. Sit on the edge of your bed if possible, or sit up as far as you can in bed or on a chair. 2. Hold the incentive spirometer in an upright position. 3. Breathe out normally. 4. Place the mouthpiece in your mouth and seal your lips tightly around it. 5. Breathe in slowly and as deeply as possible, raising the piston or the ball toward the top of the column. 6. Hold your breath for 3-5 seconds or for as long as possible. Allow the piston or ball to fall to the bottom of the column. 7. Remove the mouthpiece from your mouth and breathe out normally. 8. Rest for a  few seconds and repeat Steps 1 through 7 at least 10 times every 1-2 hours when you are awake. Take your time and take a few normal breaths between deep breaths. 9. The spirometer may include an indicator to show your best effort. Use the indicator as a goal to work toward during each repetition. 10. After each set of 10 deep breaths, practice coughing to be sure your lungs are clear. If you have an incision (the cut made at the time of surgery), support your incision when coughing by placing a pillow or rolled up towels firmly against it. Once you are able to get out of bed, walk around indoors and cough well. You may stop using the incentive spirometer when instructed by your caregiver.  RISKS AND COMPLICATIONS  Breathing too quickly may cause dizziness. At an extreme, this could cause you to pass out. Take your time so you do not get dizzy or light-headed.  If you are in pain, you may  need to take or ask for pain medication before doing incentive spirometry. It is harder to take a deep breath if you are having pain. AFTER USE  Rest and breathe slowly and easily.  It can be helpful to keep track of a log of your progress. Your caregiver can provide you with a simple table to help with this. If you are using the spirometer at home, follow these instructions: SEEK MEDICAL CARE IF:   You are having difficultly using the spirometer.  You have trouble using the spirometer as often as instructed.  Your pain medication is not giving enough relief while using the spirometer.  You develop fever of 100.5 F (38.1 C) or higher. SEEK IMMEDIATE MEDICAL CARE IF:   You cough up bloody sputum that had not been present before.  You develop fever of 102 F (38.9 C) or greater.  You develop worsening pain at or near the incision site. MAKE SURE YOU:   Understand these instructions.  Will watch your condition.  Will get help right away if you are not doing well or get worse. Document Released:  06/04/2006 Document Revised: 04/16/2011 Document Reviewed: 08/05/2006 [facility] Patient Information 2014 [place], [place].

## 2013-06-10 ENCOUNTER — Other Ambulatory Visit: Payer: Self-pay | Admitting: *Deleted

## 2013-06-10 ENCOUNTER — Encounter (HOSPITAL_COMMUNITY)
Admission: RE | Admit: 2013-06-10 | Discharge: 2013-06-10 | Disposition: A | Discharge status: Home / Self Care | Payer: BC Managed Care – PPO | Source: Ambulatory Visit | Attending: Orthopedic Surgery | Admitting: Orthopedic Surgery

## 2013-06-10 ENCOUNTER — Other Ambulatory Visit: Payer: Self-pay | Admitting: Orthopedic Surgery

## 2013-06-10 DIAGNOSIS — D649 Anemia, unspecified: Secondary | ICD-10-CM | POA: Insufficient documentation

## 2013-06-10 LAB — FERRITIN: Ferritin: 6 ng/mL — ABNORMAL LOW (ref 10–291)

## 2013-06-10 LAB — HEMOGLOBIN AND HEMATOCRIT, BLOOD
HCT: 29.8 % — ABNORMAL LOW (ref 36.0–46.0)
HEMOGLOBIN: 9 g/dL — AB (ref 12.0–15.0)

## 2013-06-10 LAB — IRON AND TIBC
IRON: 20 ug/dL — AB (ref 42–135)
Saturation Ratios: 5 % — ABNORMAL LOW (ref 20–55)
TIBC: 422 ug/dL (ref 250–470)
UIBC: 402 ug/dL — ABNORMAL HIGH (ref 125–400)

## 2013-06-10 LAB — PREPARE RBC (CROSSMATCH)

## 2013-06-10 LAB — RETICULOCYTES
RBC.: 3.83 MIL/uL — ABNORMAL LOW (ref 3.87–5.11)
RETIC CT PCT: 1.7 % (ref 0.4–3.1)
Retic Count, Absolute: 65.1 10*3/uL (ref 19.0–186.0)

## 2013-06-10 LAB — VITAMIN B12: VITAMIN B 12: 387 pg/mL (ref 211–911)

## 2013-06-10 LAB — ABO/RH: ABO/RH(D): O POS

## 2013-06-10 LAB — FOLATE: Folate: >20 ng/mL

## 2013-06-10 NOTE — Progress Notes (Signed)
Here for type and crossmatch and tranfusion of 2 units of PRBC. Blood drawn and sent to lab for results.

## 2013-06-10 NOTE — Progress Notes (Signed)
Results for Terri SkainsFERGUSON, Maidie H (MRN [id number]) as of 06/10/2013 14:43  Ref. Range 06/10/2013 12:00  Hemoglobin Latest Range: 12.0-15.0 g/dL 9.0 (L)  HCT Latest Range: 36.0-46.0 % 29.8 (L)  RBC. Latest Range: 3.87-5.11 MIL/uL 3.83 (L)  Retic Ct Pct Latest Range: 0.4-3.1 % 1.7  Retic Count, Manual Latest Range: 19.0-186.0 K/uL 65.1  Prior to blood transfusion. R Jontavious Commons RN

## 2013-06-10 NOTE — Progress Notes (Signed)
Unable to record blood admin info in computer. 2nd unit started at 1538 at rate of 50. At 1400 rate 400. Marland Kitchen.vs recorded.

## 2013-06-10 NOTE — Progress Notes (Addendum)
Dr Jayme CloudGonzalez and Dr Romeo AppleHarrison notified of Hem 8.7 and Hct 29.1  Type and crossmatch on arrival 06/12/2013 Cliffton AstersPam Nyssa Sayegh RN, BSN, CNOR, [facility] Campus[facility]GRN

## 2013-06-10 NOTE — Progress Notes (Signed)
Results for Terri SkainsFERGUSON, Jaianna H (MRN [id number]) as of 06/10/2013 15:29  Ref. Range 06/10/2013 12:00  Hemoglobin Latest Range: 12.0-15.0 g/dL 9.0 (L)  HCT Latest Range: 36.0-46.0 % 29.8 (L)  RBC. Latest Range: 3.87-5.11 MIL/uL 3.83 (L)  Retic Ct Pct Latest Range: 0.4-3.1 % 1.7  Retic Count, Manual Latest Range: 19.0-186.0 K/uL 65.1

## 2013-06-10 NOTE — Progress Notes (Signed)
2nd unit PRBC complete. volumn 350 ml. No reaction. NS 50 ml to flush tubing.

## 2013-06-11 ENCOUNTER — Encounter (HOSPITAL_COMMUNITY)
Admission: RE | Admit: 2013-06-11 | Discharge: 2013-06-11 | Disposition: A | Discharge status: Home / Self Care | Payer: BC Managed Care – PPO | Source: Ambulatory Visit | Attending: Orthopedic Surgery | Admitting: Orthopedic Surgery

## 2013-06-11 LAB — HEMOGLOBIN AND HEMATOCRIT, BLOOD
HCT: 35.5 % — ABNORMAL LOW (ref 36.0–46.0)
Hemoglobin: 11.2 g/dL — ABNORMAL LOW (ref 12.0–15.0)

## 2013-06-11 NOTE — H&P (Signed)
  Patient ID: Courtney Shields, female   DOB: Feb 04, 1971, 43 y.o.   MRN: [id number]    Chief Complaint   Patient presents with   .  Arm Pain       Fractured right humerus d/t injury 06/03/13     HISTORY: -year-old female fell on April 29 injuring her right distal humerus sustaining a fracture and she fell out of bed. She complains of swelling pain and throbbing which is rated 10. She had x-rays she is on naproxen and hydrocodone. She denies numbness or  Family history diabetes hypertension stroke cancer depression disease  Past Surgical History  Procedure Laterality Date  . Tubal ligation    . Cesarean section     History  Substance Use Topics  . Smoking status: Never Smoker   . Smokeless tobacco: Not on file  . Alcohol Use: No   Past Medical History  Diagnosis Date  . Acid reflux      Review of systems asymmetric heart murmur. Was normal.  Lower extremity exam  Ambulation is normal.  The right and left lower extremity:  Inspection and palpation revealed no tenderness or abnormality in alignment in the lower extremities. Range of motion is full.  Strength is grade 5.  All joints are stable.  The left upper extremity:     Inspection and palpation revealed no abnormalities in the upper extremities.    Range of motion is full without contracture.  Motor exam is normal with grade 5 strength.  The joints are fully reduced without subluxation.  There is no atrophy or tremor and muscle tone is normal.  All joints are stable.  Right arm is swollen over the distal humerus and elbow. She has intact wrist extension as well as thumb extension is normal lites are soft touch. Pulsatile normal. Skin intact. No lymphadenopathy. No pathologic reflexes. Normal balance.  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 This    result in a functional deficit which is acceptable.  Open treatment internal fixation right humerus.  Note the patient was anemic her anemia workup showed iron deficiency anemia she was transfused 2 units showed a hemoglobin of 11

## 2013-06-11 NOTE — Progress Notes (Signed)
Results for Terri SkainsFERGUSON, Annelyse H (MRN [id number]) as of 06/11/2013 15:24  Ref. Range 06/11/2013 13:40  Hemoglobin Latest Range: 12.0-15.0 g/dL 78.211.2 (L)  HCT Latest Range: 36.0-46.0 % 35.5 (L)

## 2013-06-12 ENCOUNTER — Encounter (HOSPITAL_COMMUNITY)
Admission: RE | Disposition: A | Discharge status: Home / Self Care | Payer: Self-pay | Source: Ambulatory Visit | Attending: Orthopedic Surgery

## 2013-06-12 ENCOUNTER — Ambulatory Visit (HOSPITAL_COMMUNITY): Payer: BC Managed Care – PPO

## 2013-06-12 ENCOUNTER — Ambulatory Visit (HOSPITAL_COMMUNITY): Payer: BC Managed Care – PPO | Admitting: Anesthesiology

## 2013-06-12 ENCOUNTER — Encounter (HOSPITAL_COMMUNITY): Payer: BC Managed Care – PPO | Admitting: Anesthesiology

## 2013-06-12 ENCOUNTER — Ambulatory Visit (HOSPITAL_COMMUNITY)
Admission: RE | Admit: 2013-06-12 | Discharge: 2013-06-12 | Disposition: A | Discharge status: Home / Self Care | Payer: BC Managed Care – PPO | Source: Ambulatory Visit | Attending: Orthopedic Surgery | Admitting: Orthopedic Surgery

## 2013-06-12 DIAGNOSIS — S42411A Displaced simple supracondylar fracture without intercondylar fracture of right humerus, initial encounter for closed fracture: Secondary | ICD-10-CM

## 2013-06-12 DIAGNOSIS — Y929 Unspecified place or not applicable: Secondary | ICD-10-CM | POA: Insufficient documentation

## 2013-06-12 DIAGNOSIS — W06XXXA Fall from bed, initial encounter: Secondary | ICD-10-CM | POA: Insufficient documentation

## 2013-06-12 DIAGNOSIS — S42413A Displaced simple supracondylar fracture without intercondylar fracture of unspecified humerus, initial encounter for closed fracture: Secondary | ICD-10-CM

## 2013-06-12 DIAGNOSIS — S42309A Unspecified fracture of shaft of humerus, unspecified arm, initial encounter for closed fracture: Secondary | ICD-10-CM | POA: Insufficient documentation

## 2013-06-12 DIAGNOSIS — K219 Gastro-esophageal reflux disease without esophagitis: Secondary | ICD-10-CM | POA: Insufficient documentation

## 2013-06-12 HISTORY — PX: ORIF HUMERUS FRACTURE: SHX2126

## 2013-06-12 SURGERY — OPEN REDUCTION INTERNAL FIXATION (ORIF) DISTAL HUMERUS FRACTURE
Anesthesia: General | Laterality: Right

## 2013-06-12 MED ORDER — NALOXONE HCL 0.4 MG/ML IJ SOLN
INTRAMUSCULAR | Status: DC | PRN
  Administered 2013-06-12: 0.1 mg via INTRAVENOUS

## 2013-06-12 MED ORDER — GLYCOPYRROLATE 0.2 MG/ML IJ SOLN
0.2000 mg | Freq: Once | INTRAMUSCULAR | Status: AC
  Administered 2013-06-12: 0.2 mg via INTRAVENOUS

## 2013-06-12 MED ORDER — ROCURONIUM BROMIDE 50 MG/5ML IV SOLN
INTRAVENOUS | Status: AC
  Filled 2013-06-12: qty 1

## 2013-06-12 MED ORDER — 0.9 % SODIUM CHLORIDE (POUR BTL) OPTIME
TOPICAL | Status: DC | PRN
  Administered 2013-06-12 (×2): 1000 mL

## 2013-06-12 MED ORDER — MIDAZOLAM HCL 2 MG/2ML IJ SOLN
1.0000 mg | INTRAMUSCULAR | Status: DC | PRN
  Administered 2013-06-12: 2 mg via INTRAVENOUS

## 2013-06-12 MED ORDER — CEFAZOLIN SODIUM-DEXTROSE 2-3 GM-% IV SOLR
2.0000 g | INTRAVENOUS | Status: AC
  Administered 2013-06-12: 2 g via INTRAVENOUS
  Filled 2013-06-12: qty 50

## 2013-06-12 MED ORDER — NALOXONE HCL 0.4 MG/ML IJ SOLN
INTRAMUSCULAR | Status: AC
  Filled 2013-06-12: qty 1

## 2013-06-12 MED ORDER — GLYCOPYRROLATE 0.2 MG/ML IJ SOLN
INTRAMUSCULAR | Status: AC
  Filled 2013-06-12: qty 1

## 2013-06-12 MED ORDER — SUFENTANIL CITRATE 50 MCG/ML IV SOLN
INTRAVENOUS | Status: AC
  Filled 2013-06-12: qty 1

## 2013-06-12 MED ORDER — ONDANSETRON HCL 4 MG/2ML IJ SOLN
INTRAMUSCULAR | Status: AC
  Filled 2013-06-12: qty 2

## 2013-06-12 MED ORDER — BUPIVACAINE-EPINEPHRINE (PF) 0.5% -1:200000 IJ SOLN
INTRAMUSCULAR | Status: AC
  Filled 2013-06-12: qty 30

## 2013-06-12 MED ORDER — SUFENTANIL CITRATE 50 MCG/ML IV SOLN
INTRAVENOUS | Status: DC | PRN
  Administered 2013-06-12: 20 ug via INTRAVENOUS
  Administered 2013-06-12 (×6): 10 ug via INTRAVENOUS

## 2013-06-12 MED ORDER — SODIUM CHLORIDE BACTERIOSTATIC 0.9 % IJ SOLN
INTRAMUSCULAR | Status: AC
  Filled 2013-06-12: qty 20

## 2013-06-12 MED ORDER — HYDROCODONE-ACETAMINOPHEN 7.5-325 MG PO TABS: 1.0000 | ORAL_TABLET | ORAL | Status: DC | PRN

## 2013-06-12 MED ORDER — BUPIVACAINE-EPINEPHRINE (PF) 0.5% -1:200000 IJ SOLN
INTRAMUSCULAR | Status: DC | PRN
  Administered 2013-06-12: 30 mL

## 2013-06-12 MED ORDER — NEOSTIGMINE METHYLSULFATE 10 MG/10ML IV SOLN
INTRAVENOUS | Status: AC
  Filled 2013-06-12: qty 1

## 2013-06-12 MED ORDER — PROMETHAZINE HCL 12.5 MG PO TABS: 12.5000 mg | ORAL_TABLET | Freq: Four times a day (QID) | ORAL | Status: DC | PRN

## 2013-06-12 MED ORDER — ONDANSETRON HCL 4 MG/2ML IJ SOLN
4.0000 mg | Freq: Once | INTRAMUSCULAR | Status: AC | PRN
  Administered 2013-06-12: 4 mg via INTRAVENOUS

## 2013-06-12 MED ORDER — LACTATED RINGERS IV SOLN
INTRAVENOUS | Status: DC
  Administered 2013-06-12 (×2): via INTRAVENOUS

## 2013-06-12 MED ORDER — FENTANYL CITRATE 0.05 MG/ML IJ SOLN
INTRAMUSCULAR | Status: AC
  Filled 2013-06-12: qty 5

## 2013-06-12 MED ORDER — PROPOFOL 10 MG/ML IV BOLUS
INTRAVENOUS | Status: DC | PRN
Start: 2013-06-12 — End: 2013-06-12
  Administered 2013-06-12: 20 mg via INTRAVENOUS
  Administered 2013-06-12: 30 mg via INTRAVENOUS
  Administered 2013-06-12: 150 mg via INTRAVENOUS

## 2013-06-12 MED ORDER — GLYCOPYRROLATE 0.2 MG/ML IJ SOLN
INTRAMUSCULAR | Status: DC | PRN
  Administered 2013-06-12: 0.2 mg via INTRAVENOUS

## 2013-06-12 MED ORDER — SODIUM CHLORIDE 0.9 % IJ SOLN
INTRAMUSCULAR | Status: AC
  Filled 2013-06-12: qty 10

## 2013-06-12 MED ORDER — NEOSTIGMINE METHYLSULFATE 10 MG/10ML IV SOLN
INTRAVENOUS | Status: DC | PRN
  Administered 2013-06-12: 1 mg via INTRAVENOUS

## 2013-06-12 MED ORDER — LIDOCAINE HCL (CARDIAC) 10 MG/ML IV SOLN
INTRAVENOUS | Status: DC | PRN
  Administered 2013-06-12: 20 mg via INTRAVENOUS

## 2013-06-12 MED ORDER — FENTANYL CITRATE 0.05 MG/ML IJ SOLN
INTRAMUSCULAR | Status: DC | PRN
  Administered 2013-06-12: 100 ug via INTRAVENOUS
  Administered 2013-06-12 (×3): 50 ug via INTRAVENOUS

## 2013-06-12 MED ORDER — MIDAZOLAM HCL 2 MG/2ML IJ SOLN
INTRAMUSCULAR | Status: AC
  Filled 2013-06-12: qty 2

## 2013-06-12 MED ORDER — PROPOFOL 10 MG/ML IV BOLUS
INTRAVENOUS | Status: AC
  Filled 2013-06-12: qty 20

## 2013-06-12 MED ORDER — ONDANSETRON HCL 4 MG/2ML IJ SOLN
4.0000 mg | Freq: Once | INTRAMUSCULAR | Status: AC
  Administered 2013-06-12: 4 mg via INTRAVENOUS

## 2013-06-12 MED ORDER — METHOCARBAMOL 1000 MG/10ML IJ SOLN
500.0000 mg | Freq: Once | INTRAVENOUS | Status: AC
  Administered 2013-06-12: 500 mg via INTRAVENOUS
  Filled 2013-06-12: qty 5

## 2013-06-12 MED ORDER — PROMETHAZINE HCL 25 MG/ML IJ SOLN
INTRAMUSCULAR | Status: AC
  Filled 2013-06-12: qty 1

## 2013-06-12 MED ORDER — ONDANSETRON HCL 4 MG/2ML IJ SOLN
4.0000 mg | Freq: Once | INTRAMUSCULAR | Status: AC
  Administered 2013-06-12: 4 mg via INTRAVENOUS
  Filled 2013-06-12: qty 2

## 2013-06-12 MED ORDER — OXYCODONE HCL 5 MG PO TABS
5.0000 mg | ORAL_TABLET | Freq: Once | ORAL | Status: AC
  Administered 2013-06-12: 5 mg via ORAL
  Filled 2013-06-12: qty 1

## 2013-06-12 MED ORDER — BUPIVACAINE HCL (PF) 0.5 % IJ SOLN
INTRAMUSCULAR | Status: AC
  Filled 2013-06-12: qty 30

## 2013-06-12 MED ORDER — SODIUM CHLORIDE BACTERIOSTATIC 0.9 % IJ SOLN
INTRAMUSCULAR | Status: AC
  Filled 2013-06-12: qty 10

## 2013-06-12 MED ORDER — PROMETHAZINE HCL 25 MG/ML IJ SOLN
6.2500 mg | Freq: Four times a day (QID) | INTRAMUSCULAR | Status: DC | PRN
  Administered 2013-06-12: 6.25 mg via INTRAVENOUS

## 2013-06-12 MED ORDER — CHLORHEXIDINE GLUCONATE 4 % EX LIQD: 60.0000 mL | Freq: Once | CUTANEOUS | Status: DC

## 2013-06-12 MED ORDER — ROCURONIUM BROMIDE 100 MG/10ML IV SOLN
INTRAVENOUS | Status: DC | PRN
  Administered 2013-06-12: 5 mg via INTRAVENOUS
  Administered 2013-06-12: 35 mg via INTRAVENOUS
  Administered 2013-06-12: 10 mg via INTRAVENOUS

## 2013-06-12 MED ORDER — FENTANYL CITRATE 0.05 MG/ML IJ SOLN
25.0000 ug | INTRAMUSCULAR | Status: DC | PRN
  Administered 2013-06-12: 25 ug via INTRAVENOUS
  Filled 2013-06-12: qty 2

## 2013-06-12 MED ORDER — LIDOCAINE HCL (PF) 1 % IJ SOLN
INTRAMUSCULAR | Status: AC
  Filled 2013-06-12: qty 5

## 2013-06-12 SURGICAL SUPPLY — 70 items
BAG HAMPER (MISCELLANEOUS) ×2 IMPLANT
BANDAGE ELASTIC 3 VELCRO NS (GAUZE/BANDAGES/DRESSINGS) ×2 IMPLANT
BANDAGE ELASTIC 4 VELCRO NS (GAUZE/BANDAGES/DRESSINGS) ×4 IMPLANT
BANDAGE ESMARK 4X12 BL STRL LF (DISPOSABLE) ×1 IMPLANT
BIT DRILL 2.5X110 QC LCP DISP (BIT) ×2 IMPLANT
BIT DRILL 2.8 (BIT) ×1
BIT DRILL CANN QC 2.8X165 (BIT) ×1 IMPLANT
BLADE 10 SAFETY STRL DISP (BLADE) ×2 IMPLANT
BLADE HEX COATED 2.75 (ELECTRODE) ×2 IMPLANT
BLADE SURG 15 STRL LF DISP TIS (BLADE) ×1 IMPLANT
BLADE SURG 15 STRL SS (BLADE) ×1
BNDG COHESIVE 4X5 TAN STRL (GAUZE/BANDAGES/DRESSINGS) ×2 IMPLANT
BNDG ESMARK 4X12 BLUE STRL LF (DISPOSABLE) ×2
CHLORAPREP W/TINT 26ML (MISCELLANEOUS) ×2 IMPLANT
CLOTH BEACON ORANGE TIMEOUT ST (SAFETY) ×2 IMPLANT
COVER SURGICAL LIGHT HANDLE (MISCELLANEOUS) ×4 IMPLANT
CUFF TOURNIQUET SINGLE 18IN (TOURNIQUET CUFF) ×2 IMPLANT
DECANTER SPIKE VIAL GLASS SM (MISCELLANEOUS) ×2 IMPLANT
DRAPE ORTHO 2.5IN SPLIT 77X108 (DRAPES) ×2 IMPLANT
DRAPE ORTHO SPLIT 77X108 STRL (DRAPES) ×2
DRAPE PROXIMA HALF (DRAPES) ×4 IMPLANT
DRILL BIT 2.8MM (BIT) ×1
ELECT REM PT RETURN 9FT ADLT (ELECTROSURGICAL) ×2
ELECTRODE REM PT RTRN 9FT ADLT (ELECTROSURGICAL) ×1 IMPLANT
GAUZE XEROFORM 5X9 LF (GAUZE/BANDAGES/DRESSINGS) ×2 IMPLANT
GLOVE BIO SURGEON STRL SZ7 (GLOVE) ×2 IMPLANT
GLOVE BIOGEL PI IND STRL 7.0 (GLOVE) ×4 IMPLANT
GLOVE BIOGEL PI INDICATOR 7.0 (GLOVE) ×4
GLOVE ECLIPSE 6.5 STRL STRAW (GLOVE) ×2 IMPLANT
GLOVE SKINSENSE NS SZ8.0 LF (GLOVE) ×1
GLOVE SKINSENSE STRL SZ8.0 LF (GLOVE) ×1 IMPLANT
GLOVE SS N UNI LF 8.5 STRL (GLOVE) ×2 IMPLANT
GOWN STRL REUS W/TWL LRG LVL3 (GOWN DISPOSABLE) ×8 IMPLANT
INST SET MINOR BONE (KITS) ×2 IMPLANT
KIT BLADEGUARD II DBL (SET/KITS/TRAYS/PACK) ×2 IMPLANT
KIT ROOM TURNOVER APOR (KITS) ×2 IMPLANT
MANIFOLD NEPTUNE II (INSTRUMENTS) ×2 IMPLANT
MARKER SKIN DUAL TIP RULER LAB (MISCELLANEOUS) ×2 IMPLANT
NEEDLE HYPO 21X1.5 SAFETY (NEEDLE) ×2 IMPLANT
NS IRRIG 1000ML POUR BTL (IV SOLUTION) ×4 IMPLANT
PACK BASIC III (CUSTOM PROCEDURE TRAY) ×1
PACK SRG BSC III STRL LF ECLPS (CUSTOM PROCEDURE TRAY) ×1 IMPLANT
PAD ABD 5X9 TENDERSORB (GAUZE/BANDAGES/DRESSINGS) ×4 IMPLANT
PAD CAST 4YDX4 CTTN HI CHSV (CAST SUPPLIES) ×2 IMPLANT
PADDING CAST COTTON 4X4 STRL (CAST SUPPLIES) ×2
PENCIL HANDSWITCHING (ELECTRODE) ×2 IMPLANT
PLATE LCP EXTRA ARTICULAR 3.5 (Plate) ×2 IMPLANT
SCREW CORTEX 3.5 22MM (Screw) ×1 IMPLANT
SCREW CORTEX 3.5 24MM (Screw) ×3 IMPLANT
SCREW LOCK CORT ST 3.5X22 (Screw) ×1 IMPLANT
SCREW LOCK CORT ST 3.5X24 (Screw) ×3 IMPLANT
SCREW LOCK T15 FT 20X3.5XST (Screw) ×1 IMPLANT
SCREW LOCK T15 FT 24X3.5X2.9X (Screw) ×2 IMPLANT
SCREW LOCK T15 FT 28X3.5X2.9X (Screw) ×1 IMPLANT
SCREW LOCKING 3.5X20 (Screw) ×1 IMPLANT
SCREW LOCKING 3.5X24 (Screw) ×2 IMPLANT
SCREW LOCKING 3.5X26 (Screw) ×2 IMPLANT
SCREW LOCKING 3.5X28 (Screw) ×1 IMPLANT
SET BASIN LINEN APH (SET/KITS/TRAYS/PACK) ×2 IMPLANT
SPLINT J IMMOBILIZER 4X20FT (CAST SUPPLIES) ×1 IMPLANT
SPLINT J PLASTER J 4INX20Y (CAST SUPPLIES) ×1
SPONGE GAUZE 4X4 12PLY (GAUZE/BANDAGES/DRESSINGS) ×2 IMPLANT
SPONGE LAP 18X18 X RAY DECT (DISPOSABLE) ×4 IMPLANT
STAPLER VISISTAT 35W (STAPLE) ×2 IMPLANT
STOCKINETTE IMPERVIOUS LG (DRAPES) ×2 IMPLANT
SUT MON AB 0 CT1 (SUTURE) ×4 IMPLANT
SYR 30ML LL (SYRINGE) ×2 IMPLANT
SYR BULB IRRIGATION 50ML (SYRINGE) ×4 IMPLANT
TOWEL OR 17X26 4PK STRL BLUE (TOWEL DISPOSABLE) ×2 IMPLANT
YANKAUER SUCT BULB TIP 10FT TU (MISCELLANEOUS) ×2 IMPLANT

## 2013-06-12 NOTE — Transfer of Care (Signed)
Immediate Anesthesia Transfer of Care Note  Patient: Courtney Shields  Procedure(s) Performed: Procedure(s): OPEN REDUCTION INTERNAL FIXATION (ORIF) RIGHT DISTAL HUMERUS FRACTURE (Right)  Patient Location: PACU  Anesthesia Type:General  Level of Consciousness: awake and patient cooperative  Airway & Oxygen Therapy: Patient Spontanous Breathing and non-rebreather face mask  Post-op Assessment: Report given to PACU RN, Post -op Vital signs reviewed and stable and Patient moving all extremities  Post vital signs: Reviewed and stable  Complications: No apparent anesthesia complications

## 2013-06-12 NOTE — Anesthesia Preprocedure Evaluation (Signed)
Anesthesia Evaluation  Patient identified by MRN, date of birth, ID band Patient awake    Reviewed: Allergy & Precautions, H&P , NPO status , Patient's Chart, lab work & pertinent test results  Airway Mallampati: II TM Distance: >3 FB Neck ROM: Full    Dental  (+) Teeth Intact   Pulmonary neg pulmonary ROS,  breath sounds clear to auscultation        Cardiovascular negative cardio ROS  Rhythm:Regular Rate:Normal     Neuro/Psych    GI/Hepatic GERD-  Medicated and Controlled,  Endo/Other    Renal/GU      Musculoskeletal   Abdominal   Peds  Hematology  (+) anemia ,   Anesthesia Other Findings   Reproductive/Obstetrics                           Anesthesia Physical Anesthesia Plan  ASA: II  Anesthesia Plan: General   Post-op Pain Management:    Induction: Intravenous  Airway Management Planned: Oral ETT  Additional Equipment:   Intra-op Plan:   Post-operative Plan: Extubation in OR  Informed Consent: I have reviewed the patients History and Physical, chart, labs and discussed the procedure including the risks, benefits and alternatives for the proposed anesthesia with the patient or authorized representative who has indicated his/her understanding and acceptance.     Plan Discussed with:   Anesthesia Plan Comments: (Lateral position)        Anesthesia Quick Evaluation

## 2013-06-12 NOTE — Anesthesia Postprocedure Evaluation (Signed)
  Anesthesia Post-op Note  Patient: Courtney SkainsPaulette H Shields  Procedure(s) Performed: Procedure(s): OPEN REDUCTION INTERNAL FIXATION (ORIF) RIGHT DISTAL HUMERUS FRACTURE (Right)  Patient Location: PACU  Anesthesia Type:General  Level of Consciousness: awake, alert  and patient cooperative  Airway and Oxygen Therapy: Patient Spontanous Breathing and non-rebreather face mask  Post-op Pain: mild  Post-op Assessment: Post-op Vital signs reviewed, Patient's Cardiovascular Status Stable, Respiratory Function Stable, Patent Airway, No signs of Nausea or vomiting and Pain level controlled  Post-op Vital Signs: Reviewed and stable   Complications: No apparent anesthesia complications

## 2013-06-12 NOTE — Op Note (Signed)
06/12/2013  10:02 AM  PATIENT:  Courtney Shields  42 y.o. female  PRE-OPERATIVE DIAGNOSIS:  Right humerus fracture  POST-OPERATIVE DIAGNOSIS:  Right humerus fracture  PROCEDURE:  Procedure(s): OPEN REDUCTION INTERNAL FIXATION (ORIF) RIGHT DISTAL HUMERUS FRACTURE (Right) synthes lateral distal humeral plate combo locking and non locking   SURGEON:  Surgeon(s) and Role:    * Sianne Tejada E Rewa Weissberg, MD - Primary  PHYSICIAN ASSISTANT:   ASSISTANTS: betty ashley    ANESTHESIA:   general  EBL:  Total I/O In: 1300 [I.V.:1300] Out: 20 [Blood:20]  BLOOD ADMINISTERED:none  DRAINS: none   LOCAL MEDICATIONS USED:  MARCAINE   , Amount: 30 ml and OTHER epi  SPECIMEN:  No Specimen  DISPOSITION OF SPECIMEN:  N/A  COUNTS:  YES  TOURNIQUET:  * Missing tourniquet times found for documented tourniquets in log:  156107 * 76 min at 250 mm DICTATION: .Dragon Dictation  PLAN OF CARE: pending pacu recovery   PATIENT DISPOSITION:  PACU - hemodynamically stable.   Delay start of Pharmacological VTE agent (>24hrs) due to surgical blood loss or risk of bleeding: not applicable  Details of procedure. The patient identified in the preoperative holding area. Site confirmation was completed chart was reviewed. Patient was taken to the operating room for general anesthesia and then placed in the lateral decubitus position. The right arm was then prepped and draped sterilely. 2 g of Ancef were given IV.  Timeout was completed  A midline incision was made over the right posterior humerus. Subcutaneous stitch was divided down to the triceps fascia. The interval between the lateral and long head of the triceps tendon was bluntly dissected and the medial triceps was split longitudinally. Subperiosteal dissection expose the fracture. The radial nerve was identified and retracted gently laterally.  The fracture site was debridement. Open reduction was performed. The Synthes lateral distal humeral plate  was applied to the bone along with a bone clamp and once reduction was confirmed and plate position was acceptable radiograph was obtained to confirm. I was satisfied with the position of the plate and the reduction. We then placed 2 cortical screws and nonlocking fashion. We then placed a combination of locking and nonlocking screws.  We checked the radial nerve after each screw was placed. The nerve is on top of the plate.  The wounds were irrigated. The nerve was checked again and found to be on top of the plate and intact.  The subcutaneous tissue was closed with 0 Monocryl in interrupted fashion followed by skin staples to reapproximate the skin.  We then injected 30 cc of Marcaine with epinephrine and applied sterile dressings and a posterior splint with the arm in 90 of flexion  The patient was extubated and taken to recovery in stable condition.    

## 2013-06-12 NOTE — Anesthesia Procedure Notes (Signed)
Procedure Name: Intubation Date/Time: 06/12/2013 7:51 AM Performed by: Franco NonesYATES, Arvle Grabe S Pre-anesthesia Checklist: Patient identified, Patient being monitored, Timeout performed, Emergency Drugs available and Suction available Patient Re-evaluated:Patient Re-evaluated prior to inductionOxygen Delivery Method: Circle System Utilized Preoxygenation: Pre-oxygenation with 100% oxygen Intubation Type: IV induction Ventilation: Mask ventilation without difficulty Laryngoscope Size: Miller and 2 Grade View: Grade I Tube type: Oral Tube size: 7.0 mm Number of attempts: 1 Airway Equipment and Method: stylet Placement Confirmation: ETT inserted through vocal cords under direct vision,  positive ETCO2 and breath sounds checked- equal and bilateral Secured at: 21 cm Tube secured with: Tape Dental Injury: Teeth and Oropharynx as per pre-operative assessment

## 2013-06-12 NOTE — Interval H&P Note (Signed)
History and Physical Interval Note:  06/12/2013 7:21 AM  Courtney Shields  has presented today for surgery, with the diagnosis of Right humerus fracture  The various methods of treatment have been discussed with the patient and family. After consideration of risks, benefits and other options for treatment, the patient has consented to  Procedure(s) with comments: OPEN REDUCTION INTERNAL FIXATION (ORIF) DISTAL HUMERUS FRACTURE (Right) - needs synthes plates as a surgical intervention .  The patient's history has been reviewed, patient examined, no change in status, stable for surgery.  I have reviewed the patient's chart and labs.  Questions were answered to the patient's satisfaction.   DX RIGHT DISTAL HUMERUS FRACTURE   Vickki HearingStanley E Tangela Dolliver

## 2013-06-12 NOTE — Brief Op Note (Signed)
06/12/2013  10:02 AM  PATIENT:  Courtney Shields  43 y.o. female  PRE-OPERATIVE DIAGNOSIS:  Right humerus fracture  POST-OPERATIVE DIAGNOSIS:  Right humerus fracture  PROCEDURE:  Procedure(s): OPEN REDUCTION INTERNAL FIXATION (ORIF) RIGHT DISTAL HUMERUS FRACTURE (Right) synthes lateral distal humeral plate combo locking and non locking   SURGEON:  Surgeon(s) and Role:    * Vickki HearingStanley E Sourish Allender, MD - Primary  PHYSICIAN ASSISTANT:   ASSISTANTS: betty ashley    ANESTHESIA:   general  EBL:  Total I/O In: 1300 [I.V.:1300] Out: 20 [Blood:20]  BLOOD ADMINISTERED:none  DRAINS: none   LOCAL MEDICATIONS USED:  MARCAINE   , Amount: 30 ml and OTHER epi  SPECIMEN:  No Specimen  DISPOSITION OF SPECIMEN:  N/A  COUNTS:  YES  TOURNIQUET:  * Missing tourniquet times found for documented tourniquets in log:  [id number] * 76 min at 250 mm DICTATION: .Dragon Dictation  PLAN OF CARE: pending pacu recovery   PATIENT DISPOSITION:  PACU - hemodynamically stable.   Delay start of Pharmacological VTE agent (>24hrs) due to surgical blood loss or risk of bleeding: not applicable  Details of procedure. The patient identified in the preoperative holding area. Site confirmation was completed chart was reviewed. Patient was taken to the operating room for general anesthesia and then placed in the lateral decubitus position. The right arm was then prepped and draped sterilely. 2 g of Ancef were given IV.  Timeout was completed  A midline incision was made over the right posterior humerus. Subcutaneous stitch was divided down to the triceps fascia. The interval between the lateral and long head of the triceps tendon was bluntly dissected and the medial triceps was split longitudinally. Subperiosteal dissection expose the fracture. The radial nerve was identified and retracted gently laterally.  The fracture site was debridement. Open reduction was performed. The Synthes lateral distal humeral plate  was applied to the bone along with a bone clamp and once reduction was confirmed and plate position was acceptable radiograph was obtained to confirm. I was satisfied with the position of the plate and the reduction. We then placed 2 cortical screws and nonlocking fashion. We then placed a combination of locking and nonlocking screws.  We checked the radial nerve after each screw was placed. The nerve is on top of the plate.  The wounds were irrigated. The nerve was checked again and found to be on top of the plate and intact.  The subcutaneous tissue was closed with 0 Monocryl in interrupted fashion followed by skin staples to reapproximate the skin.  We then injected 30 cc of Marcaine with epinephrine and applied sterile dressings and a posterior splint with the arm in 90 of flexion  The patient was extubated and taken to recovery in stable condition.

## 2013-06-12 NOTE — Discharge Instructions (Signed)
Elbow Fracture, Simple °A fracture is a break in one of the bones.When fractures are not displaced or separated, they may be treated with only a sling or splint. The sling or splint may only be required for two to three weeks. In these cases, often the elbow is put through early range of motion exercises to prevent the elbow from getting stiff. °DIAGNOSIS  °The diagnosis (learning what is wrong) of a fractured elbow is made by x-ray. These may be required before and after the elbow is put into a splint or cast. X-rays are taken after to make sure the bone pieces have not moved. °HOME CARE INSTRUCTIONS  °· Only take over-the-counter or prescription medicines for pain, discomfort, or fever as directed by your caregiver. °· If you have a splint held on with an elastic wrap and your hand or fingers become numb or cold and blue, loosen the wrap and reapply more loosely. See your caregiver if there is no relief. °· You may use ice for twenty minutes, four times per day, for the first two to three days. °· Use your elbow as directed. °· See your caregiver as directed. It is very important to keep all follow-up referrals and appointments in order to avoid any long-term problems with your elbow including chronic pain or stiffness. °SEEK IMMEDIATE MEDICAL CARE IF:  °· There is swelling or increasing pain in elbow. °· You begin to lose feeling or experience numbness or tingling in your hand or fingers. °· You develop swelling of the hand and fingers. °· You get a cold or blue hand or fingers on affected side. °MAKE SURE YOU:  °· Understand these instructions. °· Will watch your condition. °· Will get help right away if you are not doing well or get worse. °Document Released: 01/16/2001 Document Revised: 04/16/2011 Document Reviewed: 12/07/2008 °[employer]® Patient Information ©2014 [employer]. ° °

## 2013-06-12 NOTE — Addendum Note (Signed)
Addendum created 06/12/13 1129 by Franco Noneseresa S Sameer Teeple, CRNA   Modules edited: Anesthesia Flowsheet, Charges VN

## 2013-06-14 LAB — TYPE AND SCREEN
ABO/RH(D): O POS
Antibody Screen: NEGATIVE
UNIT DIVISION: 0
UNIT DIVISION: 0
Unit division: 0
Unit division: 0

## 2013-06-15 ENCOUNTER — Encounter: Payer: Self-pay | Admitting: Orthopedic Surgery

## 2013-06-15 ENCOUNTER — Ambulatory Visit (INDEPENDENT_AMBULATORY_CARE_PROVIDER_SITE_OTHER): Payer: BC Managed Care – PPO | Admitting: Orthopedic Surgery

## 2013-06-15 VITALS — BP 169/97 | Ht 69.0 in | Wt 189.0 lb

## 2013-06-15 DIAGNOSIS — S42411A Displaced simple supracondylar fracture without intercondylar fracture of right humerus, initial encounter for closed fracture: Secondary | ICD-10-CM

## 2013-06-15 DIAGNOSIS — S42413A Displaced simple supracondylar fracture without intercondylar fracture of unspecified humerus, initial encounter for closed fracture: Secondary | ICD-10-CM

## 2013-06-15 NOTE — Progress Notes (Signed)
Patient ID: Courtney Shields, female   DOB: [birth year], 43 y.o.   MRN: [id number]  Chief Complaint  Patient presents with  . Follow-up    Post op # 1 OTIF right humerus DOS 06/12/13   BP 169/97  Ht 5\' 9"  (1.753 m)  Wt 189 lb (85.73 kg)  BMI 27.90 kg/m2  LMP 06/09/2013  Postop visit #13 days postop open treatment internal fixation distal humerus fracture with Synthes lateral supracondylar plate. The patient is doing well has some numbness in her thumb but has active extension of the thumb as well as the interphalangeal joint as well as the metacarpophalangeal joints and the wrist. She has some tingling in the thumb as stated  Splint was rewrapped.  She will followup for x-rays out of plaster and removal of staples.

## 2013-06-16 ENCOUNTER — Encounter (HOSPITAL_COMMUNITY): Payer: Self-pay | Admitting: Orthopedic Surgery

## 2013-06-23 ENCOUNTER — Ambulatory Visit (INDEPENDENT_AMBULATORY_CARE_PROVIDER_SITE_OTHER): Payer: BC Managed Care – PPO

## 2013-06-23 ENCOUNTER — Ambulatory Visit (INDEPENDENT_AMBULATORY_CARE_PROVIDER_SITE_OTHER): Payer: BC Managed Care – PPO | Admitting: Orthopedic Surgery

## 2013-06-23 ENCOUNTER — Ambulatory Visit: Payer: BC Managed Care – PPO

## 2013-06-23 ENCOUNTER — Encounter: Payer: Self-pay | Admitting: Orthopedic Surgery

## 2013-06-23 VITALS — BP 167/106 | Ht 69.0 in | Wt 193.0 lb

## 2013-06-23 DIAGNOSIS — S42409A Unspecified fracture of lower end of unspecified humerus, initial encounter for closed fracture: Secondary | ICD-10-CM

## 2013-06-23 MED ORDER — NAPROXEN 500 MG PO TABS
500.0000 mg | ORAL_TABLET | Freq: Two times a day (BID) | ORAL | Status: DC
Start: 2013-06-23 — End: 2017-10-22

## 2013-06-23 NOTE — Progress Notes (Signed)
Patient ID: Courtney Shields, female   DOB: 10/31/1970, 43 y.o.   MRN: [id number] Chief Complaint  Patient presents with  . Follow-up    1 week recheck on right humerus with staple removal and xray. DOS 06-12-13.   BP 167/106  Ht 5\' 9"  (1.753 m)  Wt 193 lb (87.544 kg)  BMI 28.49 kg/m2  LMP 06/09/2013  11 days postop open treatment internal fixation right humerus Synthes plate  X-rays show fracture solid. Hardware intact.  Wound looks clean. Patient placed in range of motion brace 90 flexion 60 extension  Followup 2 weeks 20-30 more degrees  Patient instructed to exercise the arm 100 flexion exercises daily

## 2013-07-02 NOTE — Addendum Note (Signed)
Encounter addended by: Myra Rude, RN on: 07/02/2013  3:04 PM<BR>     Documentation filed: Charges VN

## 2013-07-07 ENCOUNTER — Ambulatory Visit (INDEPENDENT_AMBULATORY_CARE_PROVIDER_SITE_OTHER): Payer: BC Managed Care – PPO | Admitting: Orthopedic Surgery

## 2013-07-07 ENCOUNTER — Encounter: Payer: Self-pay | Admitting: Orthopedic Surgery

## 2013-07-07 VITALS — BP 183/102 | Ht 69.0 in | Wt 193.0 lb

## 2013-07-07 DIAGNOSIS — S42411A Displaced simple supracondylar fracture without intercondylar fracture of right humerus, initial encounter for closed fracture: Secondary | ICD-10-CM

## 2013-07-07 DIAGNOSIS — S42413A Displaced simple supracondylar fracture without intercondylar fracture of unspecified humerus, initial encounter for closed fracture: Secondary | ICD-10-CM

## 2013-07-07 DIAGNOSIS — S42409A Unspecified fracture of lower end of unspecified humerus, initial encounter for closed fracture: Secondary | ICD-10-CM

## 2013-07-07 NOTE — Progress Notes (Signed)
Patient ID: Courtney Shields, female   DOB: 1970-12-24, 43 y.o.   MRN: [id number]  Chief Complaint  Patient presents with  . Follow-up    2 week recheck adjust brace only DOS 06/12/13    4 weeks postop internal fixation distal humerus fracture currently in a brace with advanced the brace down to 40 extension  The wound excluded she has numbness in the thumb but intact radial nerve and posterior interosseous nerve motor function  Continue brace x-ray in 2 weeks and brace down to 20

## 2013-07-21 ENCOUNTER — Encounter: Payer: Self-pay | Admitting: Orthopedic Surgery

## 2013-07-21 ENCOUNTER — Ambulatory Visit (INDEPENDENT_AMBULATORY_CARE_PROVIDER_SITE_OTHER): Payer: BC Managed Care – PPO

## 2013-07-21 ENCOUNTER — Ambulatory Visit (INDEPENDENT_AMBULATORY_CARE_PROVIDER_SITE_OTHER): Payer: Self-pay | Admitting: Orthopedic Surgery

## 2013-07-21 ENCOUNTER — Telehealth: Payer: Self-pay | Admitting: Orthopedic Surgery

## 2013-07-21 VITALS — BP 145/83 | Ht 69.0 in | Wt 193.0 lb

## 2013-07-21 DIAGNOSIS — S42409A Unspecified fracture of lower end of unspecified humerus, initial encounter for closed fracture: Secondary | ICD-10-CM

## 2013-07-21 NOTE — Telephone Encounter (Signed)
[facility], all dates of service through today's office visit, 07/21/13, to fax [phone]; authorization on file; patient aware of fax completion.

## 2013-07-21 NOTE — Progress Notes (Signed)
Patient ID: Courtney Shields H Linenberger, female   DOB: 11/12/1970, 43 y.o.   MRN: [id number]  Chief Complaint  Patient presents with  . Follow-up    post op right humerus ORIF DOS 06/12/13    Approximate 6 weeks postop OTI of right distal humerus with plate and screws. The numbness in the right thumb is resolving. She has some weakness in grip.  She has excellent extension of the wrist extension of the thumb extensors of the metacarpophalangeal joint indicating radial nerve function is intact  Her x-rays show stable fixation  Her brace is 0-120  Recommend removing the brace twice a day to work on flexion and continue brace wear otherwise and continue elbow exercises  Followup in 6 weeks for x-rays of the elbow  Out of work status she continued into August.

## 2013-07-21 NOTE — Patient Instructions (Addendum)
July 30TH RIGHT HUMERUS FRACTURE X-RAYS  Twice a day remove brace and bend as far as you can Continue 100 bends

## 2013-09-03 ENCOUNTER — Encounter: Payer: Self-pay | Admitting: Orthopedic Surgery

## 2013-09-03 ENCOUNTER — Ambulatory Visit (INDEPENDENT_AMBULATORY_CARE_PROVIDER_SITE_OTHER): Payer: Self-pay | Admitting: Orthopedic Surgery

## 2013-09-03 ENCOUNTER — Ambulatory Visit (INDEPENDENT_AMBULATORY_CARE_PROVIDER_SITE_OTHER): Payer: BC Managed Care – PPO

## 2013-09-03 VITALS — BP 153/89 | Ht 69.0 in | Wt 193.0 lb

## 2013-09-03 DIAGNOSIS — S42209A Unspecified fracture of upper end of unspecified humerus, initial encounter for closed fracture: Secondary | ICD-10-CM

## 2013-09-03 DIAGNOSIS — S42413A Displaced simple supracondylar fracture without intercondylar fracture of unspecified humerus, initial encounter for closed fracture: Secondary | ICD-10-CM | POA: Insufficient documentation

## 2013-09-03 DIAGNOSIS — S42309D Unspecified fracture of shaft of humerus, unspecified arm, subsequent encounter for fracture with routine healing: Secondary | ICD-10-CM

## 2013-09-03 DIAGNOSIS — S42411D Displaced simple supracondylar fracture without intercondylar fracture of right humerus, subsequent encounter for fracture with routine healing: Secondary | ICD-10-CM

## 2013-09-03 NOTE — Patient Instructions (Signed)
Release to work  Ok to lift 50 lbs  Wear brace until Sept 30 th

## 2013-09-03 NOTE — Progress Notes (Signed)
Patient ID: Courtney Shields, female   DOB: July 07, 1970, 43 y.o.   MRN: [id number]  Chief Complaint  Patient presents with  . Follow-up    6 week recheck on right humerus with xray. DOS 06-12-13.    43 year old female with OT I F. right humerus distal and doing well. No pain. No signs of infection.  X-ray shows fracture healing  Shows less than 5 loss of extension she has full flexion full pronation supination and the numbness that was in her thumb is just about completely gone.  She can return to work on the 20th with a brace for 6 weeks  Followup 3 months for routine checkup

## 2013-12-03 ENCOUNTER — Ambulatory Visit: Payer: BC Managed Care – PPO | Admitting: Orthopedic Surgery

## 2013-12-03 ENCOUNTER — Encounter: Payer: Self-pay | Admitting: Orthopedic Surgery

## 2014-01-12 ENCOUNTER — Ambulatory Visit: Payer: BC Managed Care – PPO | Admitting: Orthopedic Surgery

## 2014-01-12 ENCOUNTER — Encounter: Payer: Self-pay | Admitting: Orthopedic Surgery

## 2015-04-08 ENCOUNTER — Emergency Department (HOSPITAL_COMMUNITY)
Admission: EM | Admit: 2015-04-08 | Discharge: 2015-04-08 | Disposition: A | Discharge status: Home / Self Care | Payer: BLUE CROSS/BLUE SHIELD | Attending: Emergency Medicine | Admitting: Emergency Medicine

## 2015-04-08 ENCOUNTER — Emergency Department (HOSPITAL_COMMUNITY): Payer: BLUE CROSS/BLUE SHIELD

## 2015-04-08 ENCOUNTER — Encounter (HOSPITAL_COMMUNITY): Payer: Self-pay | Admitting: Emergency Medicine

## 2015-04-08 DIAGNOSIS — Z79899 Other long term (current) drug therapy: Secondary | ICD-10-CM | POA: Insufficient documentation

## 2015-04-08 DIAGNOSIS — J0110 Acute frontal sinusitis, unspecified: Secondary | ICD-10-CM | POA: Diagnosis not present

## 2015-04-08 DIAGNOSIS — J029 Acute pharyngitis, unspecified: Secondary | ICD-10-CM | POA: Diagnosis not present

## 2015-04-08 DIAGNOSIS — Z791 Long term (current) use of non-steroidal anti-inflammatories (NSAID): Secondary | ICD-10-CM | POA: Insufficient documentation

## 2015-04-08 DIAGNOSIS — R05 Cough: Secondary | ICD-10-CM | POA: Diagnosis present

## 2015-04-08 MED ORDER — AMOXICILLIN 500 MG PO CAPS: 500.0000 mg | ORAL_CAPSULE | Freq: Three times a day (TID) | ORAL | Status: DC

## 2015-04-08 NOTE — ED Provider Notes (Signed)
CSN: [id number]     Arrival date & time 04/08/15  1259 History   First MD Initiated Contact with Patient 04/08/15 1336     Chief Complaint  Patient presents with  . Cough     (Consider location/radiation/quality/duration/timing/severity/associated sxs/prior Treatment) Patient is a 45 y.o. female presenting with cough. The history is provided by the patient. No language interpreter was used.  Cough Cough characteristics:  Non-productive Severity:  Moderate Onset quality:  Gradual Duration:  3 days Timing:  Constant Progression:  Worsening Chronicity:  New Context: upper respiratory infection   Relieved by:  Nothing Worsened by:  Nothing tried Ineffective treatments:  None tried Associated symptoms: sinus congestion and sore throat   Risk factors: recent infection     Past Medical History  Diagnosis Date  . Acid reflux    Past Surgical History  Procedure Laterality Date  . Tubal ligation    . Cesarean section    . Orif humerus fracture Right 06/12/2013    Procedure: OPEN REDUCTION INTERNAL FIXATION (ORIF) RIGHT DISTAL HUMERUS FRACTURE;  Surgeon: Vickki HearingStanley E Harrison, MD;  Location: [facility];  Service: Orthopedics;  Laterality: Right;   No family history on file. Social History  Substance Use Topics  . Smoking status: Never Smoker   . Smokeless tobacco: None  . Alcohol Use: No   OB History    No data available     Review of Systems  HENT: Positive for sore throat.   Respiratory: Positive for cough.   All other systems reviewed and are negative.     Allergies  Bee venom  Home Medications   Prior to Admission medications   Medication Sig Start Date End Date Taking? Authorizing Provider  amoxicillin (AMOXIL) 500 MG capsule Take 1 capsule (500 mg total) by mouth 3 (three) times daily. 04/08/15   Elson AreasLeslie K Sofia, PA-C  HYDROcodone-acetaminophen (NORCO) 7.5-325 MG per tablet Take 1 tablet by mouth every 4 (four) hours as needed for moderate pain. 06/12/13   Vickki HearingStanley E  Harrison, MD  naproxen (NAPROSYN) 500 MG tablet Take 1 tablet (500 mg total) by mouth 2 (two) times daily. 06/23/13   Vickki HearingStanley E Harrison, MD  promethazine (PHENERGAN) 12.5 MG tablet Take 1 tablet (12.5 mg total) by mouth every 6 (six) hours as needed for nausea or vomiting. 06/12/13   Vickki HearingStanley E Harrison, MD   BP 165/51 mmHg  Pulse 80  Temp(Src) 99.1 F (37.3 C) (Oral)  Resp 20  Ht 5\' 9"  (1.753 m)  Wt 89.812 kg  BMI 29.23 kg/m2  SpO2 100%  LMP 04/02/2015 Physical Exam  Constitutional: She is oriented to person, place, and time. She appears well-developed and well-nourished.  HENT:  Head: Normocephalic and atraumatic.  Right Ear: External ear normal.  Mouth/Throat: Oropharynx is clear and moist.  Tender maxillary sinuses,  Eyes: Conjunctivae and EOM are normal. Pupils are equal, round, and reactive to light.  Neck: Normal range of motion. Neck supple.  Cardiovascular: Normal rate and normal heart sounds.   Pulmonary/Chest: Effort normal.  Abdominal: Soft. She exhibits no distension.  Musculoskeletal: Normal range of motion.  Neurological: She is alert and oriented to person, place, and time.  Skin: Skin is warm and dry.  Psychiatric: She has a normal mood and affect.  Nursing note and vitals reviewed.   ED Course  Procedures (including critical care time) Labs Review Labs Reviewed - No data to display  Imaging Review Dg Chest 2 View  04/08/2015  CLINICAL DATA:  Cough since Tuesday.  Fever. EXAM: CHEST  2 VIEW COMPARISON:  07/28/2012 FINDINGS: The heart size and mediastinal contours are within normal limits. Both lungs are clear. No pleural effusion or pneumothorax. The visualized skeletal structures are unremarkable. IMPRESSION: No active cardiopulmonary disease. Electronically Signed   By: Amie Portland M.D.   On: 04/08/2015 13:30   I have personally reviewed and evaluated these images and lab results as part of my medical decision-making.   EKG Interpretation None       MDM   Final diagnoses:  Acute frontal sinusitis, recurrence not specified    Meds ordered this encounter  Medications  . amoxicillin (AMOXIL) 500 MG capsule    Sig: Take 1 capsule (500 mg total) by mouth 3 (three) times daily.    Dispense:  30 capsule    Refill:  0    Order Specific Question:  Supervising Provider    Answer:  Eber Hong [[id number]]  An After Visit Summary was printed and given to the patient.    Lonia Skinner Sterling, PA-C 04/08/15 1636  Bethann Berkshire, MD 04/09/15 [phone]

## 2015-04-08 NOTE — ED Notes (Signed)
Pt reports cough, HA, and bilateral otaliga since Tuesday.

## 2015-04-08 NOTE — Discharge Instructions (Signed)

## 2015-05-08 IMAGING — CR DG CHEST 2V
2 series · 2 of 2 positions shown · non-contrast
Comparison: Radiographs 03/30/2009.

CLINICAL DATA: Low back and right chest pain worse with inspiration
for 5 days.

CHEST - 2 VIEW

[view not recorded (1 of 2)]
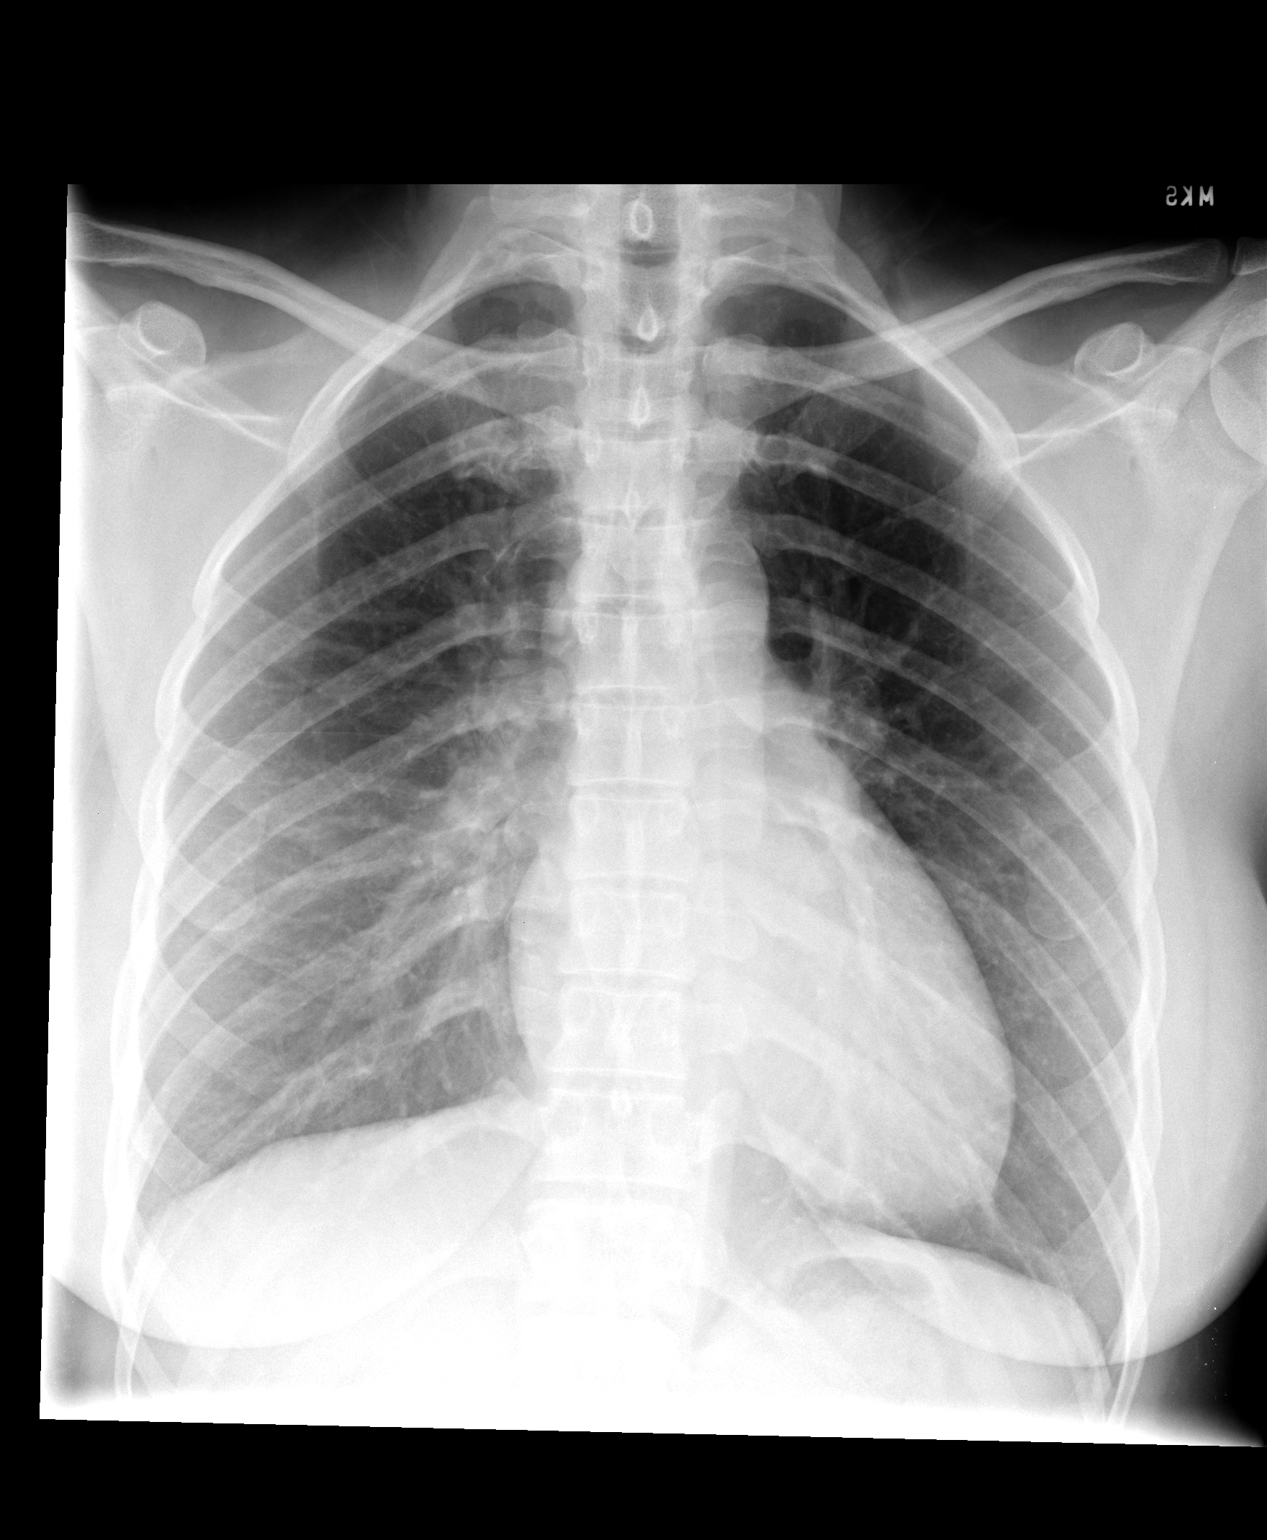

[view not recorded (2 of 2)]
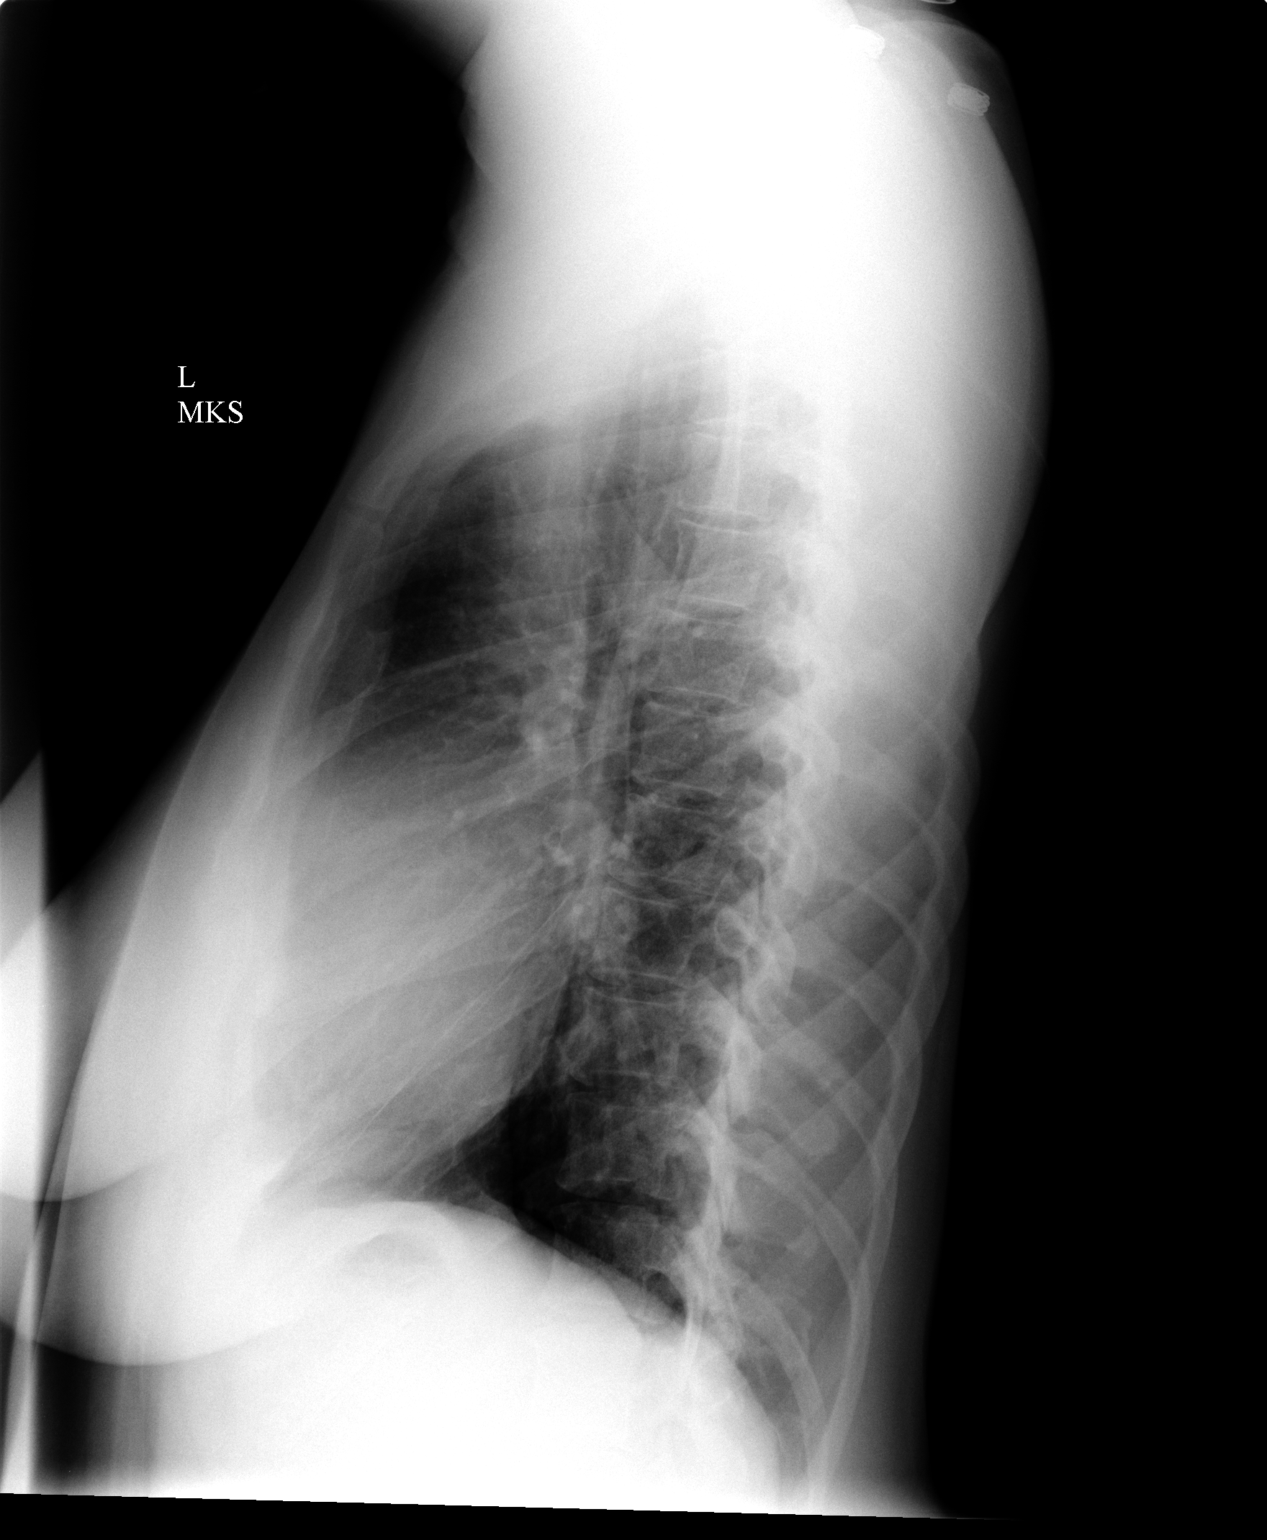

[2 of 2 positions shown; findings below may reference images not displayed]

FINDINGS: The heart size and mediastinal contours are normal. The
lungs are clear. There is no pleural effusion or pneumothorax. No
acute osseous findings are identified.  A narrow AP diameter of the
chest is unchanged.
IMPRESSION: Stable examination.  No acute cardiopulmonary process.

## 2016-03-22 IMAGING — RF DG C-ARM 61-120 MIN
1 series · 8 of 8 positions shown · non-contrast
Comparison: 06/03/2013

CLINICAL DATA: ORIF of a distal right humerus fracture

EXAM:
DG C-ARM 1-60 MIN; RIGHT HUMERUS - 2+ VIEW
:

[Series 1: run · 8 of 8 slices shown]
[im 1/8]
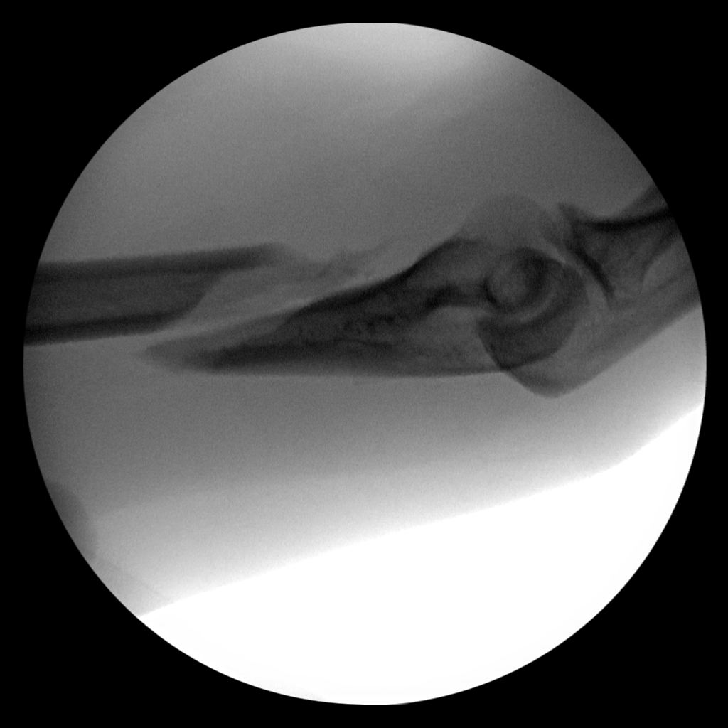
[im 2/8]
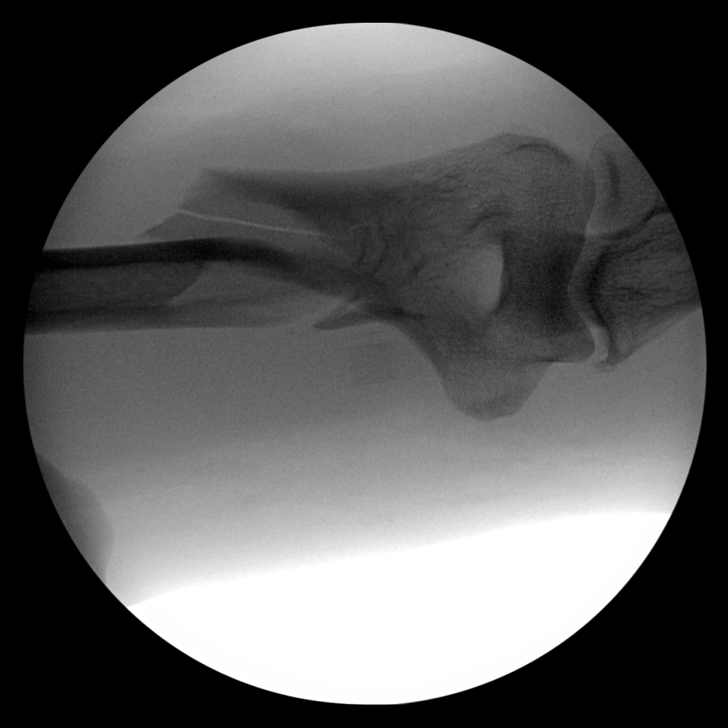
[im 3/8]
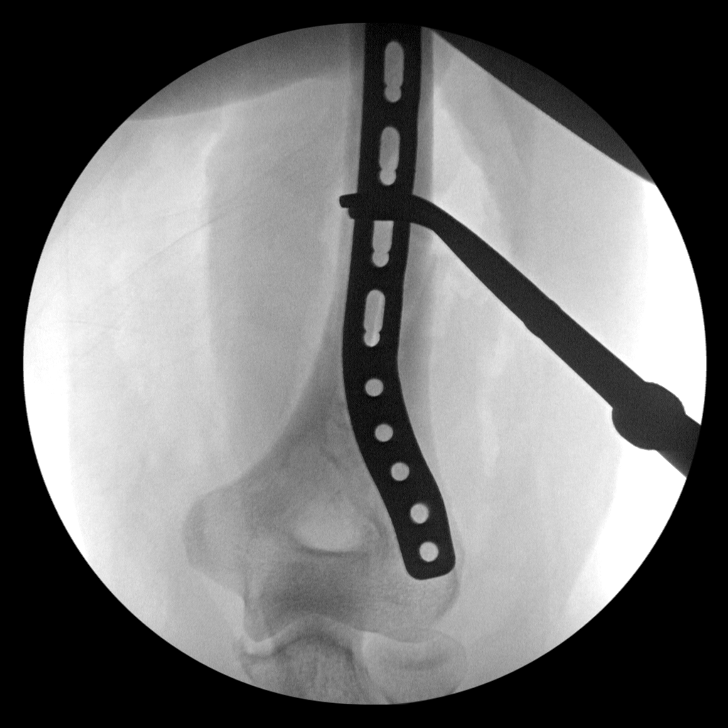
[im 4/8]
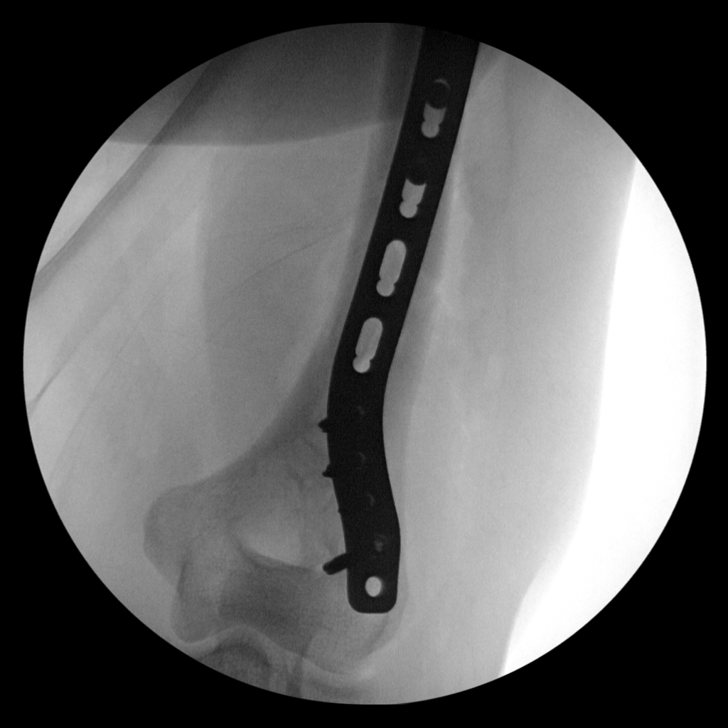
[im 5/8]
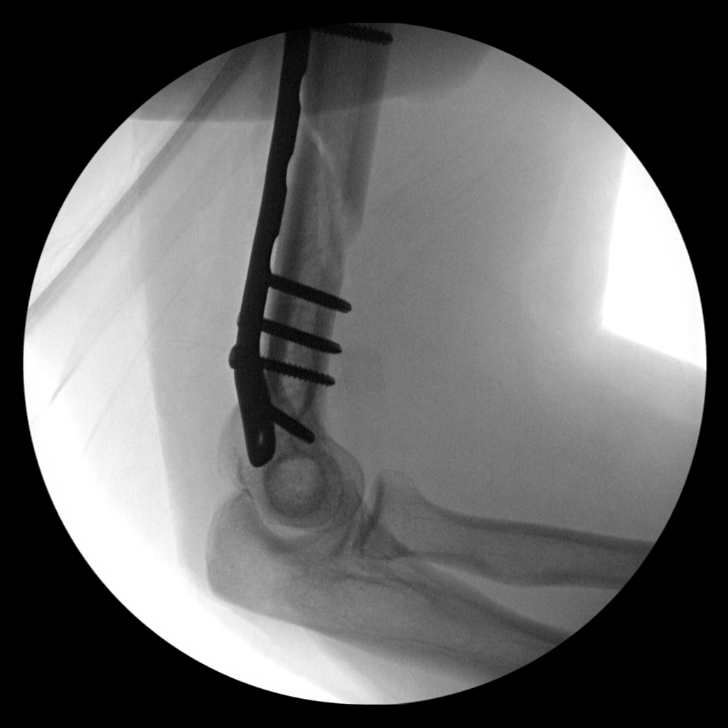
[im 6/8]
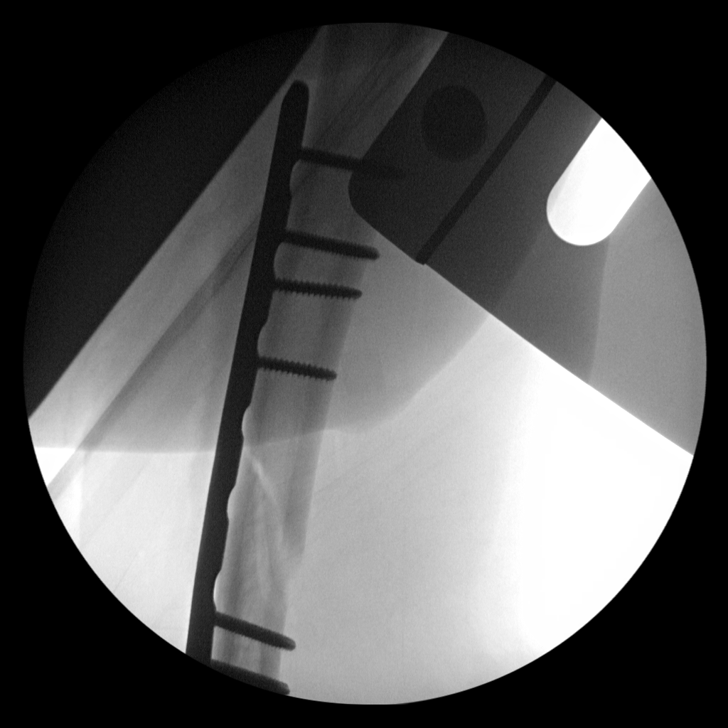
[im 7/8]
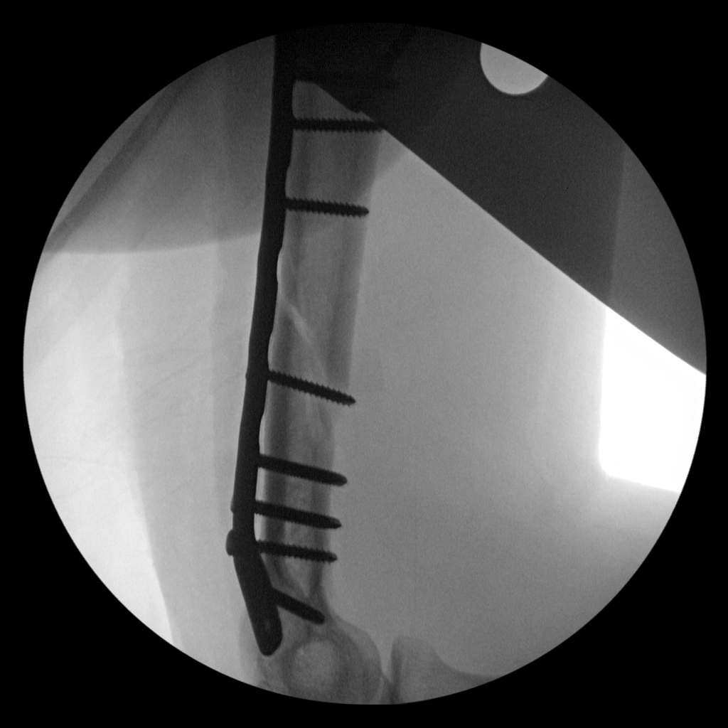
[im 8/8]
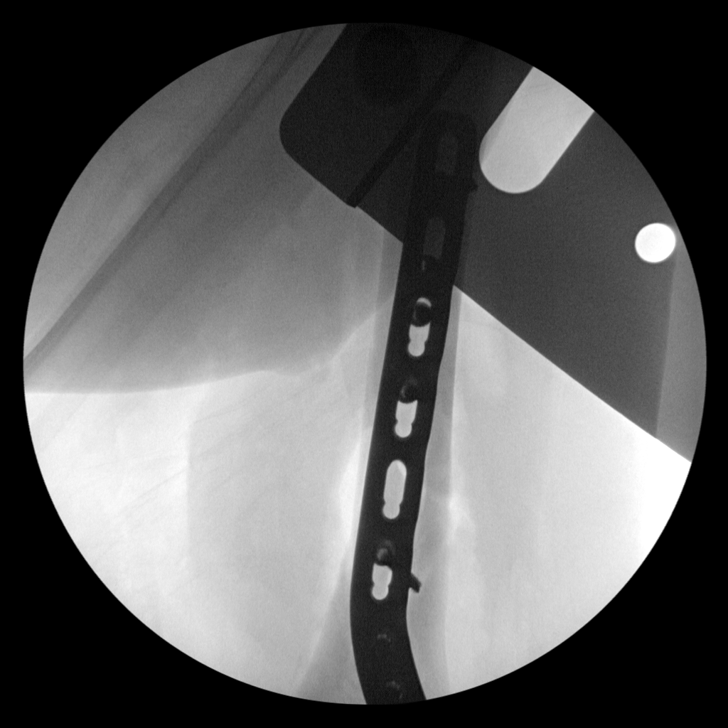

[8 of 8 positions shown; findings below may reference images not displayed]

FINDINGS: Images show placement of a posterior fusion plate and screws
reducing the distal humerus fracture fragments into near anatomic
alignment. There is no new fracture or evidence of an operative
complication.
IMPRESSION: ORIF of a distal right humeral fracture. Please refer to the
procedure report for a complete description.

## 2016-04-19 ENCOUNTER — Emergency Department (HOSPITAL_COMMUNITY)
Admission: EM | Admit: 2016-04-19 | Discharge: 2016-04-20 | Disposition: A | Discharge status: Home / Self Care | Payer: BLUE CROSS/BLUE SHIELD | Attending: Emergency Medicine | Admitting: Emergency Medicine

## 2016-04-19 ENCOUNTER — Encounter (HOSPITAL_COMMUNITY): Payer: Self-pay

## 2016-04-19 DIAGNOSIS — Z79899 Other long term (current) drug therapy: Secondary | ICD-10-CM | POA: Diagnosis not present

## 2016-04-19 DIAGNOSIS — M5416 Radiculopathy, lumbar region: Secondary | ICD-10-CM | POA: Insufficient documentation

## 2016-04-19 DIAGNOSIS — N390 Urinary tract infection, site not specified: Secondary | ICD-10-CM | POA: Diagnosis not present

## 2016-04-19 DIAGNOSIS — M549 Dorsalgia, unspecified: Secondary | ICD-10-CM | POA: Diagnosis present

## 2016-04-19 LAB — PREGNANCY, URINE: Preg Test, Ur: NEGATIVE

## 2016-04-19 MED ORDER — IBUPROFEN 800 MG PO TABS
800.0000 mg | ORAL_TABLET | Freq: Once | ORAL | Status: DC
  Filled 2016-04-19: qty 1

## 2016-04-19 NOTE — ED Triage Notes (Signed)
Patient complaining of pain in her lower back and radiating down both legs.

## 2016-04-19 NOTE — ED Provider Notes (Signed)
[facility] Provider Note   CSN: [id number] Arrival date & time: 04/19/16  2235   By signing my name below, I, Bobbie Stack, attest that this documentation has been prepared under the direction and in the presence of Glynn Octave, MD. Electronically Signed: Bobbie Stack, Scribe. 04/19/16. 11:07 PM. History   Chief Complaint Chief Complaint  Patient presents with  . Back Pain    The history is provided by the patient. No language interpreter was used.  HPI Comments: Courtney Shields is a 46 y.o. female who presents to the Emergency Department complaining of left-sided intermittent back pain that radiates to her left leg for the past 2 days. She states that she was at work at this time when the pain began. She states that the pain began in her leg and is now in her back. She denies any extraneous activities at work. She describes the pain as sharp. She states that the pain worsens when she lifts her left leg. She hasn't taken anything for her pain. She also denies weakness, numbness, vomiting, fevers, and any urinary symptoms LNMP was 03/25/2016. She denies hx of diabetes, CA, and back problems.    Past Medical History:  Diagnosis Date  . Acid reflux     Patient Active Problem List   Diagnosis Date Noted  . Closed fracture of unspecified part of upper end of humerus 09/03/2013  . Closed fracture of humerus, supracondylar 09/03/2013  . Right supracondylar humerus fracture 06/04/2013    Past Surgical History:  Procedure Laterality Date  . CESAREAN SECTION    . ORIF HUMERUS FRACTURE Right 06/12/2013   Procedure: OPEN REDUCTION INTERNAL FIXATION (ORIF) RIGHT DISTAL HUMERUS FRACTURE;  Surgeon: Vickki Hearing, MD;  Location: [facility];  Service: Orthopedics;  Laterality: Right;  . TUBAL LIGATION      OB History    No data available       Home Medications    Prior to Admission medications   Medication Sig Start Date End Date Taking? Authorizing Provider    amoxicillin (AMOXIL) 500 MG capsule Take 1 capsule (500 mg total) by mouth 3 (three) times daily. 04/08/15   Elson Areas, PA-C  HYDROcodone-acetaminophen (NORCO) 7.5-325 MG per tablet Take 1 tablet by mouth every 4 (four) hours as needed for moderate pain. 06/12/13   Vickki Hearing, MD  naproxen (NAPROSYN) 500 MG tablet Take 1 tablet (500 mg total) by mouth 2 (two) times daily. 06/23/13   Vickki Hearing, MD  promethazine (PHENERGAN) 12.5 MG tablet Take 1 tablet (12.5 mg total) by mouth every 6 (six) hours as needed for nausea or vomiting. 06/12/13   Vickki Hearing, MD    Family History No family history on file.  Social History Social History  Substance Use Topics  . Smoking status: Never Smoker  . Smokeless tobacco: Never Used  . Alcohol use No     Allergies   Bee venom   Review of Systems Review of Systems  A complete 10 system review of systems was obtained and all systems are negative except as noted in the HPI and PMH.    Physical Exam Updated Vital Signs BP (!) 166/75 (BP Location: Right Arm)   Pulse 76   Temp 99.2 F (37.3 C) (Oral)   Resp 16   Ht 5\' 9"  (1.753 m)   Wt 204 lb 7 oz (92.7 kg)   LMP 03/25/2016   SpO2 100%   BMI 30.19 kg/m   Physical Exam  Constitutional:  She is oriented to person, place, and time. She appears well-developed and well-nourished. No distress.  HENT:  Head: Normocephalic and atraumatic.  Mouth/Throat: Oropharynx is clear and moist. No oropharyngeal exudate.  Eyes: Conjunctivae and EOM are normal. Pupils are equal, round, and reactive to light.  Neck: Normal range of motion. Neck supple.  No meningismus.  Cardiovascular: Normal rate, regular rhythm, normal heart sounds and intact distal pulses.   No murmur heard. Pulmonary/Chest: Effort normal and breath sounds normal. No respiratory distress.  Abdominal: Soft. There is no tenderness. There is no rebound and no guarding.  Musculoskeletal: Normal range of motion. She  exhibits no edema or tenderness.  Left SI joint tenderness. No midline lumbar tenderness. 5/5 strength in bilateral lower extremities. Ankle plantar and dorsiflexion intact. Great toe extension intact bilaterally. +2 DP and PT pulses. +2 patellar reflexes bilaterally. Normal gait.   Neurological: She is alert and oriented to person, place, and time. No cranial nerve deficit. She exhibits normal muscle tone. Coordination normal.   5/5 strength throughout. CN 2-12 intact.Equal grip strength.   Skin: Skin is warm.  Psychiatric: She has a normal mood and affect. Her behavior is normal.  Nursing note and vitals reviewed.   ED Treatments / Results  DIAGNOSTIC STUDIES: Oxygen Saturation is 100% on RA, normal by my interpretation.    COORDINATION OF CARE: 11:03 PM Discussed treatment plan with pt at bedside and pt agreed to plan. I will discharge the patient with some pain medication.  Labs (all labs ordered are listed, but only abnormal results are displayed) Labs Reviewed  URINALYSIS, ROUTINE W REFLEX MICROSCOPIC - Abnormal; Notable for the following:       Result Value   Specific Gravity, Urine >1.030 (*)    Hgb urine dipstick SMALL (*)    Leukocytes, UA SMALL (*)    All other components within normal limits  URINALYSIS, MICROSCOPIC (REFLEX) - Abnormal; Notable for the following:    Bacteria, UA MANY (*)    Squamous Epithelial / LPF 6-30 (*)    All other components within normal limits  PREGNANCY, URINE    EKG  EKG Interpretation None       Radiology No results found.  Procedures Procedures (including critical care time)  Medications Ordered in ED Medications  ibuprofen (ADVIL,MOTRIN) tablet 800 mg (not administered)     Initial Impression / Assessment and Plan / ED Course  I have reviewed the triage vital signs and the nursing notes.  Pertinent labs & imaging results that were available during my care of the patient were reviewed by me and considered in my medical  decision making (see chart for details).     Patient presents with 2 days of left-sided low back pain radiating down her left leg. There is no associated weakness, numbness or tingling. No bowel or bladder incontinence. No fever or vomiting. No urinary symptoms. Denies any trauma.  Exam is reassuring. No evidence of cord compression or cauda equina. Suspect lumbar radiculopathy.  Treat with antiinflammatories, muscle relaxers.  Will also treat possible UTI.  Follow up with PCP. Return precaution discussed.   Final Clinical Impressions(s) / ED Diagnoses   Final diagnoses:  Left lumbar radiculopathy  Urinary tract infection without hematuria, site unspecified    New Prescriptions New Prescriptions   No medications on file  I personally performed the services described in this documentation, which was scribed in my presence. The recorded information has been reviewed and is accurate.    Glynn OctaveStephen Xaiden Fleig, MD  04/20/16 0735  

## 2016-04-20 LAB — URINALYSIS, ROUTINE W REFLEX MICROSCOPIC
Bilirubin Urine: NEGATIVE
GLUCOSE, UA: NEGATIVE mg/dL
Ketones, ur: NEGATIVE mg/dL
NITRITE: NEGATIVE
PH: 6 (ref 5.0–8.0)
Protein, ur: NEGATIVE mg/dL

## 2016-04-20 LAB — URINALYSIS, MICROSCOPIC (REFLEX)

## 2016-04-20 MED ORDER — METHYLPREDNISOLONE 4 MG PO TBPK: ORAL_TABLET | ORAL | 0 refills | Status: DC

## 2016-04-20 MED ORDER — CEPHALEXIN 500 MG PO CAPS: 500.0000 mg | ORAL_CAPSULE | Freq: Four times a day (QID) | ORAL | 0 refills | Status: DC

## 2016-04-20 MED ORDER — IBUPROFEN 800 MG PO TABS: 800.0000 mg | ORAL_TABLET | Freq: Three times a day (TID) | ORAL | 0 refills | Status: DC

## 2016-04-20 NOTE — Discharge Instructions (Signed)
Take the antiinflammatories as prescribed. Followup with your doctor. Return to the ED if you develop new or worsening symptoms. °

## 2016-04-20 NOTE — ED Notes (Signed)
Pt ambulatory to waiting room. Pt verbalized understanding of discharge instructions.   

## 2016-10-18 ENCOUNTER — Emergency Department (HOSPITAL_COMMUNITY)
Admission: EM | Admit: 2016-10-18 | Discharge: 2016-10-18 | Disposition: A | Discharge status: Home / Self Care | Payer: BLUE CROSS/BLUE SHIELD | Attending: Emergency Medicine | Admitting: Emergency Medicine

## 2016-10-18 ENCOUNTER — Encounter (HOSPITAL_COMMUNITY): Payer: Self-pay | Admitting: *Deleted

## 2016-10-18 DIAGNOSIS — Z79899 Other long term (current) drug therapy: Secondary | ICD-10-CM | POA: Diagnosis not present

## 2016-10-18 DIAGNOSIS — K047 Periapical abscess without sinus: Secondary | ICD-10-CM | POA: Diagnosis not present

## 2016-10-18 DIAGNOSIS — R6884 Jaw pain: Secondary | ICD-10-CM | POA: Diagnosis present

## 2016-10-18 MED ORDER — CLINDAMYCIN HCL 300 MG PO CAPS: 300.0000 mg | ORAL_CAPSULE | Freq: Four times a day (QID) | ORAL | 0 refills | Status: DC

## 2016-10-18 MED ORDER — CLINDAMYCIN HCL 150 MG PO CAPS
300.0000 mg | ORAL_CAPSULE | Freq: Once | ORAL | Status: AC
  Administered 2016-10-18: 300 mg via ORAL
  Filled 2016-10-18: qty 2

## 2016-10-18 NOTE — ED Provider Notes (Signed)
[facility]-EMERG[facility]Y DEPT Provider Note   CSN: [id number] Arrival date & time: 10/18/16  0001     History   Chief Complaint Chief Complaint  Patient presents with  . Facial Swelling    HPI Courtney Shields is a 46 y.o. female.  Patient is a 46 year old female with no significant past medical history. She presents with pain and swelling to her right lower jaw. She has several bad teeth that were to be extracted, however she put that off. Her pain is now worsening. She denies any fevers or chills. She denies any swelling of her neck, difficulty breathing, or difficulty swallowing.   The history is provided by the patient.    Past Medical History:  Diagnosis Date  . Acid reflux     Patient Active Problem List   Diagnosis Date Noted  . Closed fracture of unspecified part of upper end of humerus 09/03/2013  . Closed fracture of humerus, supracondylar 09/03/2013  . Right supracondylar humerus fracture 06/04/2013    Past Surgical History:  Procedure Laterality Date  . CESAREAN SECTION    . ORIF HUMERUS FRACTURE Right 06/12/2013   Procedure: OPEN REDUCTION INTERNAL FIXATION (ORIF) RIGHT DISTAL HUMERUS FRACTURE;  Surgeon: Vickki Hearing, MD;  Location: [facility];  Service: Orthopedics;  Laterality: Right;  . TUBAL LIGATION      OB History    No data available       Home Medications    Prior to Admission medications   Medication Sig Start Date End Date Taking? Authorizing Provider  amoxicillin (AMOXIL) 500 MG capsule Take 1 capsule (500 mg total) by mouth 3 (three) times daily. 04/08/15   Elson Areas, PA-C  cephALEXin (KEFLEX) 500 MG capsule Take 1 capsule (500 mg total) by mouth 4 (four) times daily. 04/20/16   Rancour, Jeannett Senior, MD  HYDROcodone-acetaminophen (NORCO) 7.5-325 MG per tablet Take 1 tablet by mouth every 4 (four) hours as needed for moderate pain. 06/12/13   Vickki Hearing, MD  ibuprofen (ADVIL,MOTRIN) 800 MG tablet Take 1 tablet (800 mg total) by mouth 3  (three) times daily. 04/20/16   Rancour, Jeannett Senior, MD  methylPREDNISolone (MEDROL DOSEPAK) 4 MG TBPK tablet As directed 04/20/16   Rancour, Jeannett Senior, MD  naproxen (NAPROSYN) 500 MG tablet Take 1 tablet (500 mg total) by mouth 2 (two) times daily. 06/23/13   Vickki Hearing, MD  promethazine (PHENERGAN) 12.5 MG tablet Take 1 tablet (12.5 mg total) by mouth every 6 (six) hours as needed for nausea or vomiting. 06/12/13   Vickki Hearing, MD    Family History No family history on file.  Social History Social History  Substance Use Topics  . Smoking status: Never Smoker  . Smokeless tobacco: Never Used  . Alcohol use No     Allergies   Bee venom   Review of Systems Review of Systems  All other systems reviewed and are negative.    Physical Exam Updated Vital Signs BP (!) 182/81   Pulse 78   Temp 98.7 F (37.1 C) (Oral)   Resp 20   Ht  (1.753 m)   Wt 92.5 kg (204 lb)   SpO2 100%   BMI 30.13 kg/m   Physical Exam  Constitutional: She is oriented to person, place, and time. She appears well-developed and well-nourished. No distress.  HENT:  Head: Normocephalic and atraumatic.  Mouth/Throat: Oropharynx is clear and moist.  The right lower molars have significant decay. There is surrounding gingival inflammation, however no obvious  sign of abscess. There is no stridorand no swelling or crepitus of the submental space.  Neck: Normal range of motion. Neck supple.  Pulmonary/Chest: Effort normal.  Musculoskeletal: Normal range of motion.  Neurological: She is alert and oriented to person, place, and time.  Skin: Skin is warm and dry. She is not diaphoretic.  Nursing note and vitals reviewed.    ED Treatments / Results  Labs (all labs ordered are listed, but only abnormal results are displayed) Labs Reviewed - No data to display  EKG  EKG Interpretation None       Radiology No results found.  Procedures Procedures (including critical care  time)  Medications Ordered in ED Medications - No data to display   Initial Impression / Assessment and Plan / ED Course  I have reviewed the triage vital signs and the nursing notes.  Pertinent labs & imaging results that were available during my care of the patient were reviewed by me and considered in my medical decision making (see chart for details).  Will prescribe antibiotics and have the patient follow-up with her dentist in the next 2-3 days.  Final Clinical Impressions(s) / ED Diagnoses   Final diagnoses:  None    New Prescriptions New Prescriptions   No medications on file     Geoffery Lyonselo, Angelamarie Avakian, MD 10/18/16 0107

## 2016-10-18 NOTE — Discharge Instructions (Signed)
Clindamycin as prescribed.  Ibuprofen 600 mg every 6 hours as needed for pain.  Follow-up with dentistry in the next 2-3 days.

## 2016-10-18 NOTE — ED Triage Notes (Signed)
Pt c/o swelling and pain to right lower dental area that started yesterday,

## 2017-08-24 ENCOUNTER — Encounter (HOSPITAL_COMMUNITY): Payer: Self-pay | Admitting: Emergency Medicine

## 2017-08-24 ENCOUNTER — Other Ambulatory Visit: Payer: Self-pay

## 2017-08-24 ENCOUNTER — Emergency Department (HOSPITAL_COMMUNITY)
Admission: EM | Admit: 2017-08-24 | Discharge: 2017-08-24 | Disposition: A | Discharge status: Home / Self Care | Payer: BLUE CROSS/BLUE SHIELD | Attending: Emergency Medicine | Admitting: Emergency Medicine

## 2017-08-24 DIAGNOSIS — Z79899 Other long term (current) drug therapy: Secondary | ICD-10-CM | POA: Insufficient documentation

## 2017-08-24 DIAGNOSIS — T63461A Toxic effect of venom of wasps, accidental (unintentional), initial encounter: Secondary | ICD-10-CM | POA: Diagnosis present

## 2017-08-24 MED ORDER — DIPHENHYDRAMINE HCL 25 MG PO TABS: 25.0000 mg | ORAL_TABLET | Freq: Four times a day (QID) | ORAL | 0 refills | Status: DC

## 2017-08-24 MED ORDER — IBUPROFEN 800 MG PO TABS
800.0000 mg | ORAL_TABLET | Freq: Once | ORAL | Status: AC
  Administered 2017-08-24: 800 mg via ORAL
  Filled 2017-08-24: qty 1

## 2017-08-24 MED ORDER — PREDNISONE 10 MG PO TABS: ORAL_TABLET | ORAL | 0 refills | Status: DC

## 2017-08-24 MED ORDER — DEXAMETHASONE SODIUM PHOSPHATE 10 MG/ML IJ SOLN
10.0000 mg | Freq: Once | INTRAMUSCULAR | Status: AC
  Administered 2017-08-24: 10 mg via INTRAMUSCULAR
  Filled 2017-08-24: qty 1

## 2017-08-24 MED ORDER — FAMOTIDINE 20 MG PO TABS: 20.0000 mg | ORAL_TABLET | Freq: Two times a day (BID) | ORAL | 0 refills | Status: DC

## 2017-08-24 MED ORDER — FAMOTIDINE 20 MG PO TABS
20.0000 mg | ORAL_TABLET | Freq: Once | ORAL | Status: AC
  Administered 2017-08-24: 20 mg via ORAL
  Filled 2017-08-24: qty 1

## 2017-08-24 MED ORDER — EPINEPHRINE 0.3 MG/0.3ML IJ SOAJ: 0.3000 mg | Freq: Once | INTRAMUSCULAR | 0 refills | Status: AC

## 2017-08-24 NOTE — Discharge Instructions (Signed)
Take your next dose of prednisone tomorrow evening, one more dose of benadryl before bed tonight and your next dose of pepcid tomorrow morning.  Return here for any worsened symptoms as discussed.  As discussed, your reaction does not appear to be an anaphylactic one at this time, but return here immediately if needed.  Keep your epi pen with you at all times in the event you are ever bit by another wasp.  If you are ever bit by another yellow jacket, it is possible you next reaction could be stronger, including anaphylaxis.

## 2017-08-24 NOTE — ED Triage Notes (Signed)
Pt states she was "stung by a yellow jacket around 2000." Pt has taken 25mg  benadryl at 2000. Pt denies lip, tongue, or throat swelling.

## 2017-08-26 NOTE — ED Provider Notes (Signed)
[facility] EMERGENCY DEPARTMENT Provider Note   CSN: [id number] Arrival date & time: 08/24/17  2054     History   Chief Complaint Chief Complaint  Patient presents with  . Insect Bite    Bee sting    HPI Courtney Shields is a 47 y.o. 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  She reports localized pain and itching at the site but denies hives, cough, shortness of breath, mouth, tongue or throat swelling but has noted having to force herself to swallow and really concentrate to be able to perform this maneuver when thinking about it. She also denies nausea, abdominal pain or other complaints.  She has had 25 mg of benadryl prior to arrival.  The history is provided by the patient.    Past Medical History:  Diagnosis Date  . Acid reflux     Patient Active Problem List   Diagnosis Date Noted  . Closed fracture of unspecified part of upper end of humerus 09/03/2013  . Closed fracture of humerus, supracondylar 09/03/2013  . Right supracondylar humerus fracture 06/04/2013    Past Surgical History:  Procedure Laterality Date  . CESAREAN SECTION    . ORIF HUMERUS FRACTURE Right 06/12/2013   Procedure: OPEN REDUCTION INTERNAL FIXATION (ORIF) RIGHT DISTAL HUMERUS FRACTURE;  Surgeon: Vickki Hearing, MD;  Location: [facility];  Service: Orthopedics;  Laterality: Right;  . TUBAL LIGATION       OB History   None      Home Medications    Prior to Admission medications   Medication Sig Start Date End Date Taking? Authorizing Provider  amoxicillin (AMOXIL) 500 MG capsule Take 1 capsule (500 mg total) by mouth 3 (three) times daily. 04/08/15   Elson Areas, PA-C  cephALEXin (KEFLEX) 500 MG capsule Take 1 capsule (500 mg total) by mouth 4 (four) times daily. 04/20/16   Rancour, Jeannett Senior, MD  clindamycin (CLEOCIN) 300 MG capsule Take 1 capsule (300 mg total) by mouth 4 (four) times daily.  X 7 days 10/18/16   Geoffery Lyons, MD  diphenhydrAMINE (BENADRYL) 25 MG tablet Take 1 tablet (25 mg total) by mouth every 6 (six) hours. 08/24/17   Burgess Amor, PA-C  famotidine (PEPCID) 20 MG tablet Take 1 tablet (20 mg total) by mouth 2 (two) times daily. 08/24/17   Burgess Amor, PA-C  HYDROcodone-acetaminophen (NORCO) 7.5-325 MG per tablet Take 1 tablet by mouth every 4 (four) hours as needed for moderate pain. 06/12/13   Vickki Hearing, MD  ibuprofen (ADVIL,MOTRIN) 800 MG tablet Take 1 tablet (800 mg total) by mouth 3 (three) times daily. 04/20/16   Rancour, Jeannett Senior, MD  methylPREDNISolone (MEDROL DOSEPAK) 4 MG TBPK tablet As directed 04/20/16   Rancour, Jeannett Senior, MD  naproxen (NAPROSYN) 500 MG tablet Take 1 tablet (500 mg total) by mouth 2 (two) times daily. 06/23/13   Vickki Hearing, MD  predniSONE (DELTASONE) 10 MG tablet Take 6 tablets day one, 5 tablets day two, 4 tablets day three, 3 tablets day four, 2 tablets day five, then 1 tablet day six 08/24/17   Rosie Torrez, Raynelle Fanning, PA-C  promethazine (PHENERGAN) 12.5 MG tablet Take 1 tablet (12.5 mg total) by mouth every 6 (six) hours as needed for nausea or vomiting. 06/12/13   Vickki Hearing, MD    Family History History reviewed. No pertinent family history.  Social History Social History  Tobacco Use  . Smoking status: Never Smoker  . Smokeless tobacco: Never Used  Substance Use Topics  . Alcohol use: No  . Drug use: No     Allergies   Bee venom   Review of Systems Review of Systems  Constitutional: Negative for fever.  HENT: Positive for trouble swallowing. Negative for congestion, facial swelling, sore throat and voice change.   Eyes: Negative.   Respiratory: Negative for cough, choking, chest tightness, shortness of breath, wheezing and stridor.   Cardiovascular: Negative for chest pain.  Gastrointestinal: Negative for abdominal pain, nausea and vomiting.  Genitourinary: Negative.   Musculoskeletal: Negative for  arthralgias, joint swelling and neck pain.  Skin: Positive for wound. Negative for rash.  Neurological: Negative for dizziness, weakness, light-headedness, numbness and headaches.  Psychiatric/Behavioral: Negative.      Physical Exam Updated Vital Signs BP (!) 156/85   Pulse 66   Temp 98.4 F (36.9 C) (Oral)   Resp 16   Ht 5\' 9"  (1.753 m)   Wt 93.9 kg (207 lb)   LMP 08/05/2017   SpO2 100%   BMI 30.57 kg/m   Physical Exam  Constitutional: She appears well-developed and well-nourished.  HENT:  Head: Normocephalic and atraumatic.  Nose: No mucosal edema or rhinorrhea.  Mouth/Throat: Uvula is midline, oropharynx is clear and moist and mucous membranes are normal. No trismus in the jaw. No uvula swelling. No posterior oropharyngeal edema or posterior oropharyngeal erythema.  Eyes: Conjunctivae are normal.  Neck: Normal range of motion. Neck supple.  Cardiovascular: Normal rate, regular rhythm, normal heart sounds and intact distal pulses.  Pulmonary/Chest: Effort normal and breath sounds normal. No stridor. No respiratory distress. She has no wheezes.  Abdominal: Soft. Bowel sounds are normal. There is no tenderness.  Musculoskeletal: Normal range of motion.  Neurological: She is alert.  Skin: Skin is warm and dry.  Localized raised erythematous and indurated sting right upper forearm.  Psychiatric: She has a normal mood and affect.  Nursing note and vitals reviewed.    ED Treatments / Results  Labs (all labs ordered are listed, but only abnormal results are displayed) Labs Reviewed - No data to display  EKG None  Radiology No results found.  Procedures Procedures (including critical care time)  Medications Ordered in ED Medications  famotidine (PEPCID) tablet 20 mg (20 mg Oral Given 08/24/17 2159)  dexamethasone (DECADRON) injection 10 mg (10 mg Intramuscular Given 08/24/17 2159)  ibuprofen (ADVIL,MOTRIN) tablet 800 mg (800 mg Oral Given 08/24/17 2255)      Initial Impression / Assessment and Plan / ED Course  I have reviewed the triage vital signs and the nursing notes.  Pertinent labs & imaging results that were available during my care of the patient were reviewed by me and considered in my medical decision making (see chart for details).     Pt with history of yellow jacket sting with localized reaction and no findings to suggest anaphylaxis.  She was given pepcid, decadron and observed in the department with no worsened sx and resolution of sensation of difficulty swallowing. No wheezing or stridor during ed stay. Continued home tx with prednisone and antihistamines. Pt also given epipen script with instructions for as needed use for future exposures.      Final Clinical Impressions(s) / ED Diagnoses   Final diagnoses:  Yellow jacket sting, accidental or unintentional, initial encounter    ED Discharge Orders        Ordered    predniSONE (DELTASONE) 10  MG tablet     08/24/17 2247    famotidine (PEPCID) 20 MG tablet  2 times daily     08/24/17 2247    diphenhydrAMINE (BENADRYL) 25 MG tablet  Every 6 hours     08/24/17 2247    EPINEPHrine 0.3 mg/0.3 mL IJ SOAJ injection   Once     08/24/17 2247       Burgess Amor, PA-C 08/26/17 1414    Linwood Dibbles, MD 08/28/17 2054

## 2017-10-22 ENCOUNTER — Emergency Department (HOSPITAL_COMMUNITY): Payer: BLUE CROSS/BLUE SHIELD

## 2017-10-22 ENCOUNTER — Encounter (HOSPITAL_COMMUNITY): Payer: Self-pay | Admitting: *Deleted

## 2017-10-22 ENCOUNTER — Emergency Department (HOSPITAL_COMMUNITY)
Admission: EM | Admit: 2017-10-22 | Discharge: 2017-10-22 | Disposition: A | Discharge status: Home / Self Care | Payer: BLUE CROSS/BLUE SHIELD | Attending: Emergency Medicine | Admitting: Emergency Medicine

## 2017-10-22 ENCOUNTER — Other Ambulatory Visit: Payer: Self-pay

## 2017-10-22 DIAGNOSIS — M546 Pain in thoracic spine: Secondary | ICD-10-CM | POA: Diagnosis not present

## 2017-10-22 DIAGNOSIS — M549 Dorsalgia, unspecified: Secondary | ICD-10-CM | POA: Diagnosis present

## 2017-10-22 MED ORDER — METHOCARBAMOL 500 MG PO TABS: 500.0000 mg | ORAL_TABLET | Freq: Three times a day (TID) | ORAL | 0 refills | Status: DC | PRN

## 2017-10-22 NOTE — ED Provider Notes (Addendum)
[facility] EMERGENCY DEPARTMENT Provider Note   CSN: [id number] Arrival date & time: 10/22/17  4098     History   Chief Complaint Chief Complaint  Patient presents with  . Shoulder Pain    HPI Courtney Shields is a 47 y.o. female.  HPI Patient has had around a week of right sided back pain.  This has come and gone somewhat.  Worse with breathing movement and deep breaths.  States she feels slightly short of breath.  No trauma.  Has not really had pain like this before.  No swelling in her legs.  Does not smoke.  Not on hormones.  3 months ago went to the [place] but otherwise no recent travel.  Did carry a microwave a couple weeks ago but had no pain for a week after that.  No fevers. Past Medical History:  Diagnosis Date  . Acid reflux     Patient Active Problem List   Diagnosis Date Noted  . Closed fracture of unspecified part of upper end of humerus 09/03/2013  . Closed fracture of humerus, supracondylar 09/03/2013  . Right supracondylar humerus fracture 06/04/2013    Past Surgical History:  Procedure Laterality Date  . CESAREAN SECTION    . ORIF HUMERUS FRACTURE Right 06/12/2013   Procedure: OPEN REDUCTION INTERNAL FIXATION (ORIF) RIGHT DISTAL HUMERUS FRACTURE;  Surgeon: Vickki Hearing, MD;  Location: [facility];  Service: Orthopedics;  Laterality: Right;  . TUBAL LIGATION       OB History   None      Home Medications    Prior to Admission medications   Not on File    Family History No family history on file.  Social History Social History   Tobacco Use  . Smoking status: Never Smoker  . Smokeless tobacco: Never Used  Substance Use Topics  . Alcohol use: No  . Drug use: No     Allergies   Bee venom   Review of Systems Review of Systems  Constitutional: Negative for appetite change.  HENT: Negative for congestion.   Respiratory: Positive for shortness of breath.   Gastrointestinal: Negative for abdominal pain.  Genitourinary: Negative  for flank pain.  Musculoskeletal: Positive for back pain.  Neurological: Negative for weakness.  Psychiatric/Behavioral: Negative for confusion.     Physical Exam Updated Vital Signs BP (!) 169/77   Pulse 83   Temp 98.5 F (36.9 C) (Oral)   Resp 16   Ht 5\' 9"  (1.753 m)   Wt 94.8 kg   LMP 09/24/2017   SpO2 99%   BMI 30.86 kg/m   Physical Exam  Constitutional: She appears well-developed.  HENT:  Head: Normocephalic.  Eyes: Pupils are equal, round, and reactive to light.  Neck: Neck supple.  Cardiovascular: Normal rate.  Pulmonary/Chest: She exhibits tenderness.  Tenderness on the right back medial to the right scapula.  Pain worse with certain positions and movement.  No rash.  Lungs are clear.  Abdominal: There is no tenderness.  Musculoskeletal: She exhibits no tenderness.  Neurological: She is alert.  Skin: Skin is warm. Capillary refill takes less than 2 seconds.     ED Treatments / Results  Labs (all labs ordered are listed, but only abnormal results are displayed) Labs Reviewed - No data to display  EKG None  Radiology No results found.  Procedures Procedures (including critical care time)  Medications Ordered in ED Medications - No data to display   Initial Impression / Assessment and Plan / ED  Course  I have reviewed the triage vital signs and the nursing notes.  Pertinent labs & imaging results that were available during my care of the patient were reviewed by me and considered in my medical decision making (see chart for details).     Patient with back pain.  I think it is likely musculoskeletal.  PERC negative.  X-ray to be done.  I think likely will be able to discharge with muscle relaxer.  X-ray reviewed and reassuring.  Final Clinical Impressions(s) / ED Diagnoses   Final diagnoses:  Acute right-sided thoracic back pain    ED Discharge Orders    None       Benjiman CorePickering, Lucill Mauck, MD 10/22/17 2035    Benjiman CorePickering, Renika Shiflet, MD 10/22/17  2038

## 2017-10-22 NOTE — ED Triage Notes (Addendum)
Pt c/o right shoulder and upper back pain that has been intermittent for the past week, worse with cough, respirations, yawning, admits to sob with walking at times, pt states that she did move a microwave two weeks ago but denies any injury and also traveled [place] the [place] in [place]une

## 2018-10-07 ENCOUNTER — Other Ambulatory Visit: Payer: Self-pay

## 2018-10-07 ENCOUNTER — Emergency Department (HOSPITAL_COMMUNITY): Payer: BC Managed Care – PPO

## 2018-10-07 ENCOUNTER — Emergency Department (HOSPITAL_COMMUNITY)
Admission: EM | Admit: 2018-10-07 | Discharge: 2018-10-07 | Disposition: A | Discharge status: Home / Self Care | Payer: BC Managed Care – PPO | Attending: Emergency Medicine | Admitting: Emergency Medicine

## 2018-10-07 ENCOUNTER — Encounter (HOSPITAL_COMMUNITY): Payer: Self-pay

## 2018-10-07 DIAGNOSIS — R0789 Other chest pain: Secondary | ICD-10-CM | POA: Diagnosis not present

## 2018-10-07 DIAGNOSIS — Z7982 Long term (current) use of aspirin: Secondary | ICD-10-CM | POA: Diagnosis not present

## 2018-10-07 DIAGNOSIS — R079 Chest pain, unspecified: Secondary | ICD-10-CM | POA: Diagnosis present

## 2018-10-07 LAB — CBC
HCT: 33.5 % — ABNORMAL LOW (ref 36.0–46.0)
Hemoglobin: 9.6 g/dL — ABNORMAL LOW (ref 12.0–15.0)
MCH: 24.7 pg — ABNORMAL LOW (ref 26.0–34.0)
MCHC: 28.7 g/dL — ABNORMAL LOW (ref 30.0–36.0)
MCV: 86.3 fL (ref 80.0–100.0)
Platelets: 375 10*3/uL (ref 150–400)
RBC: 3.88 MIL/uL (ref 3.87–5.11)
RDW: 16.8 % — ABNORMAL HIGH (ref 11.5–15.5)
WBC: 5.5 10*3/uL (ref 4.0–10.5)
nRBC: 0 % (ref 0.0–0.2)

## 2018-10-07 LAB — BASIC METABOLIC PANEL
Anion gap: 8 (ref 5–15)
BUN: 10 mg/dL (ref 6–20)
CO2: 25 mmol/L (ref 22–32)
Calcium: 8.4 mg/dL — ABNORMAL LOW (ref 8.9–10.3)
Chloride: 106 mmol/L (ref 98–111)
Creatinine, Ser: 0.96 mg/dL (ref 0.44–1.00)
GFR calc Af Amer: >60 mL/min (ref >60)
GFR calc non Af Amer: >60 mL/min (ref >60)
Glucose, Bld: 92 mg/dL (ref 70–99)
Potassium: 4.3 mmol/L (ref 3.5–5.1)
Sodium: 139 mmol/L (ref 135–145)

## 2018-10-07 LAB — POCT PREGNANCY, URINE: Preg Test, Ur: NEGATIVE

## 2018-10-07 LAB — TROPONIN I (HIGH SENSITIVITY)
Troponin I (High Sensitivity): 3 ng/L (ref <18)
Troponin I (High Sensitivity): 3 ng/L (ref <18)

## 2018-10-07 MED ORDER — METHOCARBAMOL 500 MG PO TABS: 500.0000 mg | ORAL_TABLET | Freq: Every evening | ORAL | 0 refills | Status: DC | PRN

## 2018-10-07 MED ORDER — SODIUM CHLORIDE 0.9% FLUSH: 3.0000 mL | Freq: Once | INTRAVENOUS | Status: DC

## 2018-10-07 NOTE — ED Provider Notes (Signed)
[facility] EMERGENCY DEPARTMENT Provider Note   CSN: [id number] Arrival date & time: 10/07/18  1541     History   Chief Complaint Chief Complaint  Patient presents with  . Chest Pain    HPI Courtney Shields is a 48 y.o. female presenting for evaluation of chest pain.  Patient states approximately 1 hour prior to arrival she had central chest pain which radiated around to her back.  Is described as a spasm which lasted for about 10 minutes before resolving without intervention.  She had a second episode while at work, and then was brought to the ER for further evaluation.  Since then, she has had spasms-like pain that last for 1 second before disappearing.  She denies associated shortness of breath, nausea, vomiting, diaphoresis.  She denies recent fevers, chills, cough, abdominal pain, urinary symptoms, normal bowel movements.  She denies history of cardiac problems.  She denies family history of cardiac problems.  She denies history of hypertension or diabetes.  She denies tobacco, alcohol, or drug use.  She has no medical problems, takes no medications daily.     HPI  Past Medical History:  Diagnosis Date  . Acid reflux     Patient Active Problem List   Diagnosis Date Noted  . Closed fracture of unspecified part of upper end of humerus 09/03/2013  . Closed fracture of humerus, supracondylar 09/03/2013  . Right supracondylar humerus fracture 06/04/2013    Past Surgical History:  Procedure Laterality Date  . CESAREAN SECTION    . ORIF HUMERUS FRACTURE Right 06/12/2013   Procedure: OPEN REDUCTION INTERNAL FIXATION (ORIF) RIGHT DISTAL HUMERUS FRACTURE;  Surgeon: Carole Civil, MD;  Location: [facility];  Service: Orthopedics;  Laterality: Right;  . TUBAL LIGATION       OB History   No obstetric history on file.      Home Medications    Prior to Admission medications   Medication Sig Start Date End Date Taking? Authorizing Provider  aspirin EC 81 MG tablet Take  324 mg by mouth once.   Yes [provider]  methocarbamol (ROBAXIN) 500 MG tablet Take 1 tablet (500 mg total) by mouth at bedtime as needed for muscle spasms. 10/07/18   Tali Coster, PA-C    Family History No family history on file.  Social History Social History   Tobacco Use  . Smoking status: Never Smoker  . Smokeless tobacco: Never Used  Substance Use Topics  . Alcohol use: No  . Drug use: No     Allergies   Bee venom   Review of Systems Review of Systems  Cardiovascular: Positive for chest pain.  All other systems reviewed and are negative.    Physical Exam Updated Vital Signs BP 124/73 (BP Location: Left Arm)   Pulse 65   Temp 98.3 F (36.8 C) (Oral)   Resp 14   Ht 5\' 9"  (1.753 m)   Wt 90.3 kg   LMP 09/26/2018   SpO2 98%   BMI 29.40 kg/m   Physical Exam Vitals signs and nursing note reviewed.  Constitutional:      General: She is not in acute distress.    Appearance: She is well-developed.     Comments: Sitting comfortably in the bed in no acute distress  HENT:     Head: Normocephalic and atraumatic.  Eyes:     Conjunctiva/sclera: Conjunctivae normal.     Pupils: Pupils are equal, round, and reactive to light.  Neck:  Musculoskeletal: Normal range of motion and neck supple.  Cardiovascular:     Rate and Rhythm: Normal rate and regular rhythm.     Pulses: Normal pulses.  Pulmonary:     Effort: Pulmonary effort is normal. No respiratory distress.     Breath sounds: Normal breath sounds. No wheezing.     Comments: Speaking in full sentences.  Clear lung sounds in all fields. Chest:     Chest wall: No tenderness.  Abdominal:     General: There is no distension.     Palpations: Abdomen is soft. There is no mass.     Tenderness: There is no abdominal tenderness. There is no guarding or rebound.  Musculoskeletal: Normal range of motion.     Comments: No leg pain or swelling  Skin:    General: Skin is warm and dry.   Neurological:     Mental Status: She is alert and oriented to person, place, and time.      ED Treatments / Results  Labs (all labs ordered are listed, but only abnormal results are displayed) Labs Reviewed  BASIC METABOLIC PANEL - Abnormal; Notable for the following components:      Result Value   Calcium 8.4 (*)    All other components within normal limits  CBC - Abnormal; Notable for the following components:   Hemoglobin 9.6 (*)    HCT 33.5 (*)    MCH 24.7 (*)    MCHC 28.7 (*)    RDW 16.8 (*)    All other components within normal limits  POC URINE PREG, ED  POCT PREGNANCY, URINE  TROPONIN I (HIGH SENSITIVITY)  TROPONIN I (HIGH SENSITIVITY)    EKG EKG Interpretation  Date/Time:  Tuesday October 07 2018 15:56:09 EDT Ventricular Rate:  72 PR Interval:  112 QRS Duration: 82 QT Interval:  392 QTC Calculation: 429 R Axis:   86 Text Interpretation:  Normal sinus rhythm Normal ECG no acute st/ts similar to prior 2/14 Confirmed by Meridee ScoreButler, Michael [phone]([id number]) on 10/07/2018 4:27:05 PM   Radiology Dg Chest 2 View  Result Date: 10/07/2018 CLINICAL DATA:  Chest pain EXAM: CHEST - 2 VIEW COMPARISON:  10/22/2017 FINDINGS: Normal heart size, mediastinal contours, and pulmonary vascularity. Lungs clear. No pleural effusion or pneumothorax. Bones unremarkable. IMPRESSION: Normal exam. Electronically Signed   By: Ulyses SouthwardMark  Boles M.D.   On: 10/07/2018 17:46    Procedures Procedures (including critical care time)  Medications Ordered in ED Medications  sodium chloride flush (NS) 0.9 % injection 3 mL (has no administration in time range)     Initial Impression / Assessment and Plan / ED Course  I have reviewed the triage vital signs and the nursing notes.  Pertinent labs & imaging results that were available during my care of the patient were reviewed by me and considered in my medical decision making (see chart for details).        Patient presenting for evaluation of  intermittent chest pain.  Physical exam reassuring, she appears nontoxic.  Pain is described as a spasm feeling.  Patient with no risk factors.  Low suspicion for ACS, however patient was hypertensive on arrival.  This is likely due to pain and anxiety, however as such we will obtain labs including troponin to rule out ischemia.  Initial labs reassuring.  Patient anemic, this is baseline per chart review.  Initial troponin normal at 3.  EKG without STEMI.  Chest x-ray viewed interpreted by me, no pneumonia, nontoxic effusion, cardiomegaly.  As  symptoms began 1 hour prior to arrival, will obtain delta Trop.  Repeat troponin once again normal at 3.  Heart score of 1.  Doubt ACS at this time.  Doubt PE. HTN has resolved without intervention.   Discussed likely muscle spasm.  Treatment with muscle relaxers, Tylenol, ibuprofen.  Encourage follow-up with PCP if symptoms not improving.  At this time, patient appears safe for discharge.  Return precautions given.  Patient states she understands and agrees to plan.  Final Clinical Impressions(s) / ED Diagnoses   Final diagnoses:  Atypical chest pain    ED Discharge Orders         Ordered    methocarbamol (ROBAXIN) 500 MG tablet  At bedtime PRN     10/07/18 2036           Alveria Apley, PA-C 10/07/18 2147    Terrilee Files, MD 10/08/18 1020

## 2018-10-07 NOTE — Discharge Instructions (Signed)
Take ibuprofen 3 times a day with meals.  Do not take other anti-inflammatories at the same time (Advil, Motrin, naproxen, Aleve). You may supplement with Tylenol if you need further pain control. Use robaxin as needed for muscle spasm.  Have caution, this may make you tired or groggy.  Do not drive or operate heavy machinery while taking this medicine. Use muscle cream such as salon poss, icy hot, BenGay, Biofreeze to help with pain.   Return to the emergency room if you develop difficulty breathing, worsening pain, any new, worsening, concerning symptoms

## 2018-10-07 NOTE — ED Triage Notes (Addendum)
Pt started having intermittent chest pain about an hour ago at her job. Pain was central and radiated into her back and left arm. Pt is pain free now. No cardiac history, but history of back spasms. Was given 4 aspirins at work

## 2020-07-11 ENCOUNTER — Ambulatory Visit: Payer: BC Managed Care – PPO

## 2020-07-11 ENCOUNTER — Other Ambulatory Visit: Payer: Self-pay

## 2020-07-11 ENCOUNTER — Encounter: Payer: Self-pay | Admitting: Orthopedic Surgery

## 2020-07-11 ENCOUNTER — Ambulatory Visit: Payer: BC Managed Care – PPO | Admitting: Orthopedic Surgery

## 2020-07-11 VITALS — BP 179/92 | HR 70 | Ht 69.0 in | Wt 196.5 lb

## 2020-07-11 DIAGNOSIS — M79671 Pain in right foot: Secondary | ICD-10-CM | POA: Diagnosis not present

## 2020-07-11 DIAGNOSIS — M722 Plantar fascial fibromatosis: Secondary | ICD-10-CM

## 2020-07-11 MED ORDER — IBUPROFEN 800 MG PO TABS: 800.0000 mg | ORAL_TABLET | Freq: Three times a day (TID) | ORAL | 1 refills | Status: DC | PRN

## 2020-07-11 NOTE — Patient Instructions (Signed)
Follow instructions on the Planter fasciitis info sheet  Start  Meds ordered this encounter  Medications  . ibuprofen (ADVIL) 800 MG tablet    Sig: Take 1 tablet (800 mg total) by mouth every 8 (eight) hours as needed.    Dispense:  90 tablet    Refill:  1

## 2020-07-11 NOTE — Progress Notes (Signed)
NEW PROBLEM//OFFICE VISIT  Summary assessment and plan:    Planter fasciitis right foot patient decided against injection.  She can start ibuprofen continue her heel pads I added some stretching exercises she is encouraged to continue icing  Follow-up 6 weeks   Chief Complaint  Patient presents with  . Foot Pain    R/ it just started hurting. I haven't fall or anything, but its been hurting off and on since about February.    50 year old female no history of trauma 4 months ago in February she started having pain in her heel this is associated with start up pain, pain getting out of bed in the morning and restart pain on the plantar aspect she also complains of some tightness of her left calf   BP (!) 179/92   Pulse 70   Ht 5\' 9"  (1.753 m)   Wt 196 lb 8 oz (89.1 kg)   BMI 29.02 kg/m    General appearance: Well-developed well-nourished no gross deformities  Cardiovascular normal pulse and perfusion normal color without edema  Neurologically o sensation loss or deficits or pathologic reflexes  Psychological: Awake alert and oriented x3 mood and affect normal  Skin no lacerations or ulcerations no nodularity no palpable masses, no erythema or nodularity  Musculoskeletal: Right foot exam shows reasonable knee normal alignment flexible pes planus tenderness on the plantar aspect of the heel with equal but tight heel cords   Review of Systems  All other systems reviewed and are negative.    Past Medical History:  Diagnosis Date  . Acid reflux     Past Surgical History:  Procedure Laterality Date  . CESAREAN SECTION    . ORIF HUMERUS FRACTURE Right 06/12/2013   Procedure: OPEN REDUCTION INTERNAL FIXATION (ORIF) RIGHT DISTAL HUMERUS FRACTURE;  Surgeon: 08/12/2013, MD;  Location: [facility];  Service: Orthopedics;  Laterality: Right;  . TUBAL LIGATION      No family history on file. Social History   Tobacco Use  . Smoking status: Never Smoker  . Smokeless  tobacco: Never Used  Substance Use Topics  . Alcohol use: No  . Drug use: No    Allergies  Allergen Reactions  . Bee Venom Anaphylaxis    To wasp stings    Current Meds  Medication Sig  . ibuprofen (ADVIL) 800 MG tablet Take 1 tablet (800 mg total) by mouth every 8 (eight) hours as needed.    BP (!) 179/92   Pulse 70   Ht 5\' 9"  (1.753 m)   Wt 196 lb 8 oz (89.1 kg)   BMI 29.02 kg/m   Physical Exam   MEDICAL DECISION MAKING  A. No diagnosis found.  B. DATA ANALYSED:   IMAGING: Interpretation of images: Internal images show a plantar fascial spur no midfoot arthritis small calcaneal superior tuberosity sclerosis  Orders: no  Outside records reviewed: no   C. MANAGEMENT   See above     Vickki Hearing, MD  07/11/2020 11:34 AM

## 2020-08-22 ENCOUNTER — Ambulatory Visit: Payer: BC Managed Care – PPO | Admitting: Orthopedic Surgery

## 2020-09-12 ENCOUNTER — Other Ambulatory Visit: Payer: Self-pay

## 2020-09-12 ENCOUNTER — Encounter: Payer: Self-pay | Admitting: Orthopedic Surgery

## 2020-09-12 ENCOUNTER — Ambulatory Visit: Payer: BC Managed Care – PPO | Admitting: Orthopedic Surgery

## 2020-09-12 VITALS — BP 166/85 | HR 75 | Ht 69.0 in | Wt 193.2 lb

## 2020-09-12 DIAGNOSIS — M722 Plantar fascial fibromatosis: Secondary | ICD-10-CM | POA: Diagnosis not present

## 2020-09-12 NOTE — Progress Notes (Signed)
Chief Complaint  Patient presents with   plantar fasciitis    Right/follow up/pt states it has good and bad days    Encounter Diagnosis  Name Primary?   Plantar fasciitis Yes    Physical Exam  Focused foot exam right foot tenderness plantar aspect calcaneus normal range of motion neurovascular exam intact  Discussed risk of rupture which is minimal with 0ne injection   Procedure note  Injection  Verbal consent was obtained to inject the right heel  Timeout procedure was completed to confirm injection site  Diagnosis Planter fasciitis  Medications used 6 mg Celestone Lidocaine 1% plain 3 cc  Anesthesia was provided by ethyl chloride spray  Prep was performed with alcohol  Technique of injection foot was in neutral dorsiflexion position just in front of the palpable bony plantar calcaneus injection was performed under the plantar fascia  No complications were noted   We are also going to do a short cam walking boot  Chronic problem now with exacerbation expected to last at least a year plus injection

## 2020-09-12 NOTE — Patient Instructions (Addendum)
Note from ortho  Allow rest from standing every 2 hrs for 10 min as needed   Continue ice and stretching   CAM Walker short

## 2020-10-24 ENCOUNTER — Other Ambulatory Visit: Payer: Self-pay

## 2020-10-24 ENCOUNTER — Encounter: Payer: Self-pay | Admitting: Orthopedic Surgery

## 2020-10-24 ENCOUNTER — Ambulatory Visit: Payer: BC Managed Care – PPO | Admitting: Orthopedic Surgery

## 2020-10-24 VITALS — BP 139/86 | HR 79 | Ht 69.0 in | Wt 198.8 lb

## 2020-10-24 DIAGNOSIS — M722 Plantar fascial fibromatosis: Secondary | ICD-10-CM

## 2020-10-24 NOTE — Patient Instructions (Addendum)
Ice and stretching   Remove the boot

## 2020-10-24 NOTE — Progress Notes (Signed)
Chief Complaint  Patient presents with   Foot Pain    Follow up//right//pt states it is feeling better    Status post injection plantar fascial right foot  She says she has some occasional discomfort now but she has improved significantly  She has no tenderness on the plantar aspect of the heel at this time  Recommend she continue stretching and ice she can remove her boot follow-up as needed

## 2021-02-02 ENCOUNTER — Other Ambulatory Visit: Payer: Self-pay

## 2021-02-02 ENCOUNTER — Encounter (HOSPITAL_COMMUNITY): Payer: Self-pay

## 2021-02-02 ENCOUNTER — Emergency Department (HOSPITAL_COMMUNITY): Payer: BC Managed Care – PPO

## 2021-02-02 ENCOUNTER — Emergency Department (HOSPITAL_COMMUNITY)
Admission: EM | Admit: 2021-02-02 | Discharge: 2021-02-02 | Disposition: A | Discharge status: Home / Self Care | Payer: BC Managed Care – PPO | Attending: Emergency Medicine | Admitting: Emergency Medicine

## 2021-02-02 DIAGNOSIS — U071 COVID-19: Secondary | ICD-10-CM | POA: Insufficient documentation

## 2021-02-02 DIAGNOSIS — R059 Cough, unspecified: Secondary | ICD-10-CM | POA: Diagnosis present

## 2021-02-02 LAB — RESP PANEL BY RT-PCR (FLU A&B, COVID) ARPGX2
Influenza A by PCR: NEGATIVE
Influenza B by PCR: NEGATIVE
SARS Coronavirus 2 by RT PCR: POSITIVE — AB

## 2021-02-02 NOTE — ED Triage Notes (Signed)
Reports yesterday started with cough headache and bodyaches.

## 2021-02-02 NOTE — ED Notes (Signed)
PCXR done

## 2021-02-02 NOTE — Discharge Instructions (Signed)
Rest and make sure you are drinking plenty of fluids.  Continue taking your Tylenol if needed for body aches and fever.  Get rechecked for any worsening symptoms, particularly shortness of breath.  Your chest x-ray is clear today.  You will need to stay home and in isolation for a total of 7 days from yesterday.

## 2021-02-03 NOTE — ED Provider Notes (Signed)
[facility] EMERGENCY DEPARTMENT Provider Note   CSN: [id number] Arrival date & time: 02/02/21  1056     History Chief Complaint  Patient presents with   Generalized Body Aches    Shelah CHERYLYNN LISZEWSKI is a 50 y.o. female presenting for evaluation of flu like symptoms that started yesterday including fever and chills, generalized body aches, non productive cough and generalized headache.  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  She denies sob, chest pain, n/v/d or abdominal pain.  Also denies ear pain or drainage, neck pain or stiffness, dizziness.  She has been able to tolerate po intake, has been hydrating but appetite has been reduced. She has taken tylenol with some improvement in symptoms.   The history is provided by the patient.      Past Medical History:  Diagnosis Date   Acid reflux     Patient Active Problem List   Diagnosis Date Noted   Closed fracture of unspecified part of upper end of humerus 09/03/2013   Closed fracture of humerus, supracondylar 09/03/2013   Right supracondylar humerus fracture 06/04/2013    Past Surgical History:  Procedure Laterality Date   CESAREAN SECTION     ORIF HUMERUS FRACTURE Right 06/12/2013   Procedure: OPEN REDUCTION INTERNAL FIXATION (ORIF) RIGHT DISTAL HUMERUS FRACTURE;  Surgeon: Vickki Hearing, MD;  Location: [facility];  Service: Orthopedics;  Laterality: Right;   TUBAL LIGATION       OB History   No obstetric history on file.     No family history on file.  Social History   Tobacco Use   Smoking status: Never   Smokeless tobacco: Never  Substance Use Topics   Alcohol use: No   Drug use: No    Home Medications Prior to Admission medications   Medication Sig Start Date End Date Taking? Authorizing Provider  ibuprofen (ADVIL) 800 MG tablet Take 1 tablet (800 mg total) by mouth every 8 (eight) hours as needed. 07/11/20   Vickki Hearing, MD    Allergies    Bee venom  Review of Systems    Review of Systems  Constitutional:  Positive for chills and fever.  HENT:  Negative for congestion, ear pain, rhinorrhea, sinus pressure, sore throat, trouble swallowing and voice change.   Eyes:  Negative for discharge.  Respiratory:  Positive for cough. Negative for shortness of breath, wheezing and stridor.   Cardiovascular:  Negative for chest pain.  Gastrointestinal:  Negative for abdominal pain, diarrhea, nausea and vomiting.  Genitourinary: Negative.   Musculoskeletal:  Positive for myalgias. Negative for neck stiffness.  Skin: Negative.   Neurological:  Positive for headaches.   Physical Exam Updated Vital Signs BP (!) 178/76 (BP Location: Right Arm)    Pulse 86    Temp 99.8 F (37.7 C) (Oral)    Resp 16    Ht 5\' 9"  (1.753 m)    Wt 92.4 kg    SpO2 100%    BMI 30.08 kg/m   Physical Exam Constitutional:      Appearance: She is well-developed.  HENT:     Head: Normocephalic and atraumatic.     Nose: Mucosal edema present. No congestion or rhinorrhea.     Mouth/Throat:     Mouth: Mucous membranes are moist.     Pharynx: Oropharynx is clear. Uvula midline. No oropharyngeal exudate or posterior oropharyngeal erythema.     Tonsils: No tonsillar abscesses.  Eyes:     Conjunctiva/sclera: Conjunctivae normal.  Cardiovascular:     Rate and Rhythm: Normal rate.     Heart sounds: Normal heart sounds.  Pulmonary:     Effort: Pulmonary effort is normal. No respiratory distress.     Breath sounds: No wheezing, rhonchi or rales.  Abdominal:     Palpations: Abdomen is soft.     Tenderness: There is no abdominal tenderness.  Musculoskeletal:        General: Normal range of motion.  Skin:    General: Skin is warm and dry.     Findings: No rash.  Neurological:     General: No focal deficit present.     Mental Status: She is alert and oriented to person, place, and time.    ED Results / Procedures / Treatments   Labs (all labs ordered are listed, but only abnormal results are  displayed) Labs Reviewed  RESP PANEL BY RT-PCR (FLU A&B, COVID) ARPGX2 - Abnormal; Notable for the following components:      Result Value   SARS Coronavirus 2 by RT PCR POSITIVE (*)    All other components within normal limits    EKG None  Radiology DG Chest Portable 1 View  Result Date: 02/02/2021 CLINICAL DATA:  Cough and headache.  COVID positive EXAM: PORTABLE CHEST 1 VIEW COMPARISON:  10/07/2018 FINDINGS: The heart size and mediastinal contours are within normal limits. Both lungs are clear. The visualized skeletal structures are unremarkable. IMPRESSION: No active disease. Electronically Signed   By: Marlan Palau M.D.   On: 02/02/2021 14:37    Procedures Procedures   Medications Ordered in ED Medications - No data to display  ED Course  I have reviewed the triage vital signs and the nursing notes.  Pertinent labs & imaging results that were available during my care of the patient were reviewed by me and considered in my medical decision making (see chart for details).    MDM Rules/Calculators/A&P                         Pt positive for Covid. Ambulated with pulse ox, normal O2 saturation.  No sig risk factors for decompensating disease.  Return precautions and quarantine outlined.  Pt stable at time of dc.  The patient appears reasonably screened and/or stabilized for discharge and I doubt any other medical condition or other [facility] requiring further screening, evaluation, or treatment in the ED at this time prior to discharge.  Korene CARLYON NOLASCO was evaluated in Emergency Department on 02/03/2021 for the symptoms described in the history of present illness. She was evaluated in the context of the global COVID-19 pandemic, which necessitated consideration that the patient might be at risk for infection with the SARS-CoV-2 virus that causes COVID-19. Institutional protocols and algorithms that pertain to the evaluation of patients at risk for COVID-19 are in a state of rapid  change based on information released by regulatory bodies including the CDC and federal and state organizations. These policies and algorithms were followed during the patient's care in the ED.     Final Clinical Impression(s) / ED Diagnoses Final diagnoses:  COVID-19    Rx / DC Orders ED Discharge Orders     None        Victoriano Lain 02/03/21 1500    Pollyann Savoy, MD 02/06/21 1500

## 2021-07-17 IMAGING — DX DG CHEST 2V
2 series · 2 of 2 positions shown · non-contrast
Comparison: 10/22/2017

CLINICAL DATA: Chest pain

EXAM:
CHEST - 2 VIEW

[chest pa]
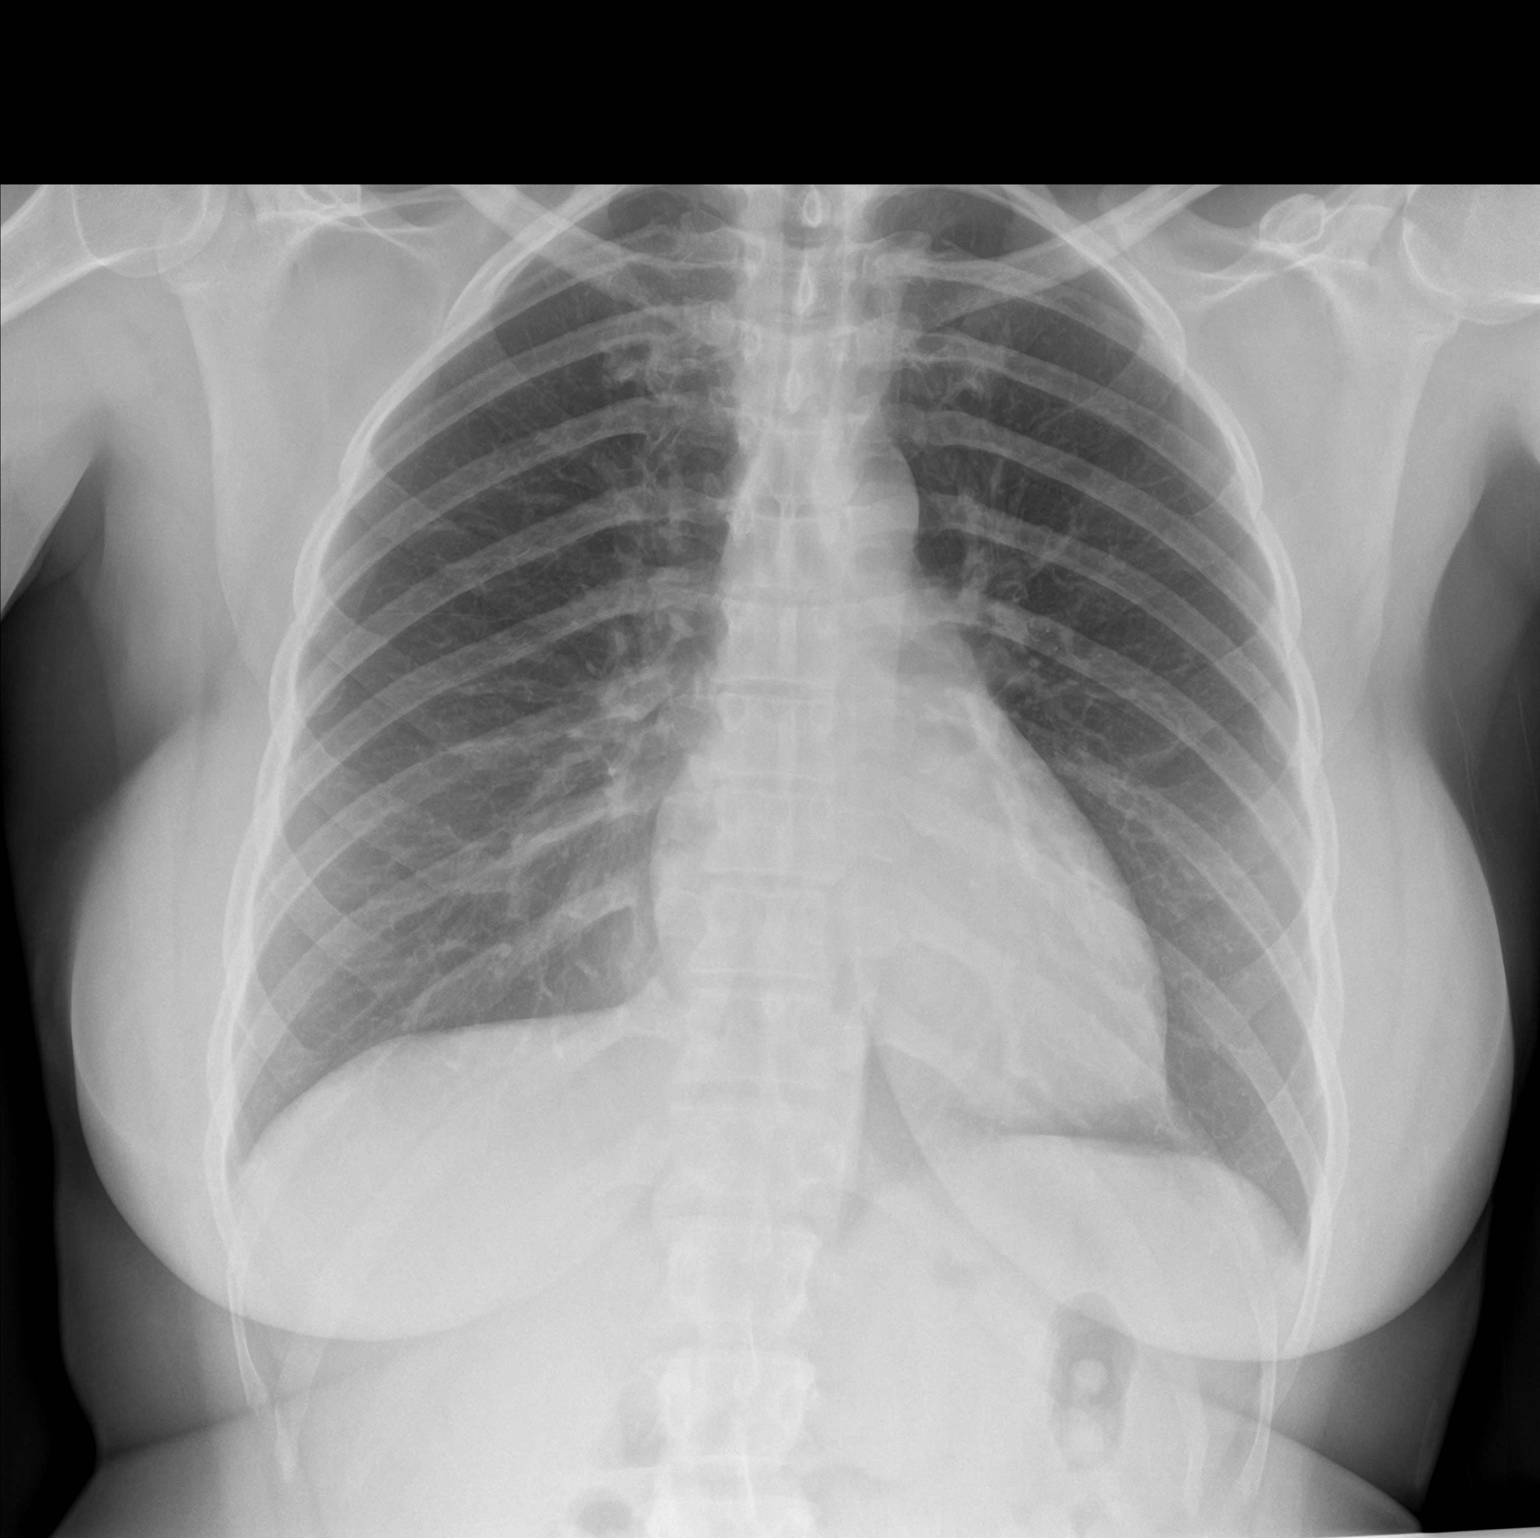

[chest lat]
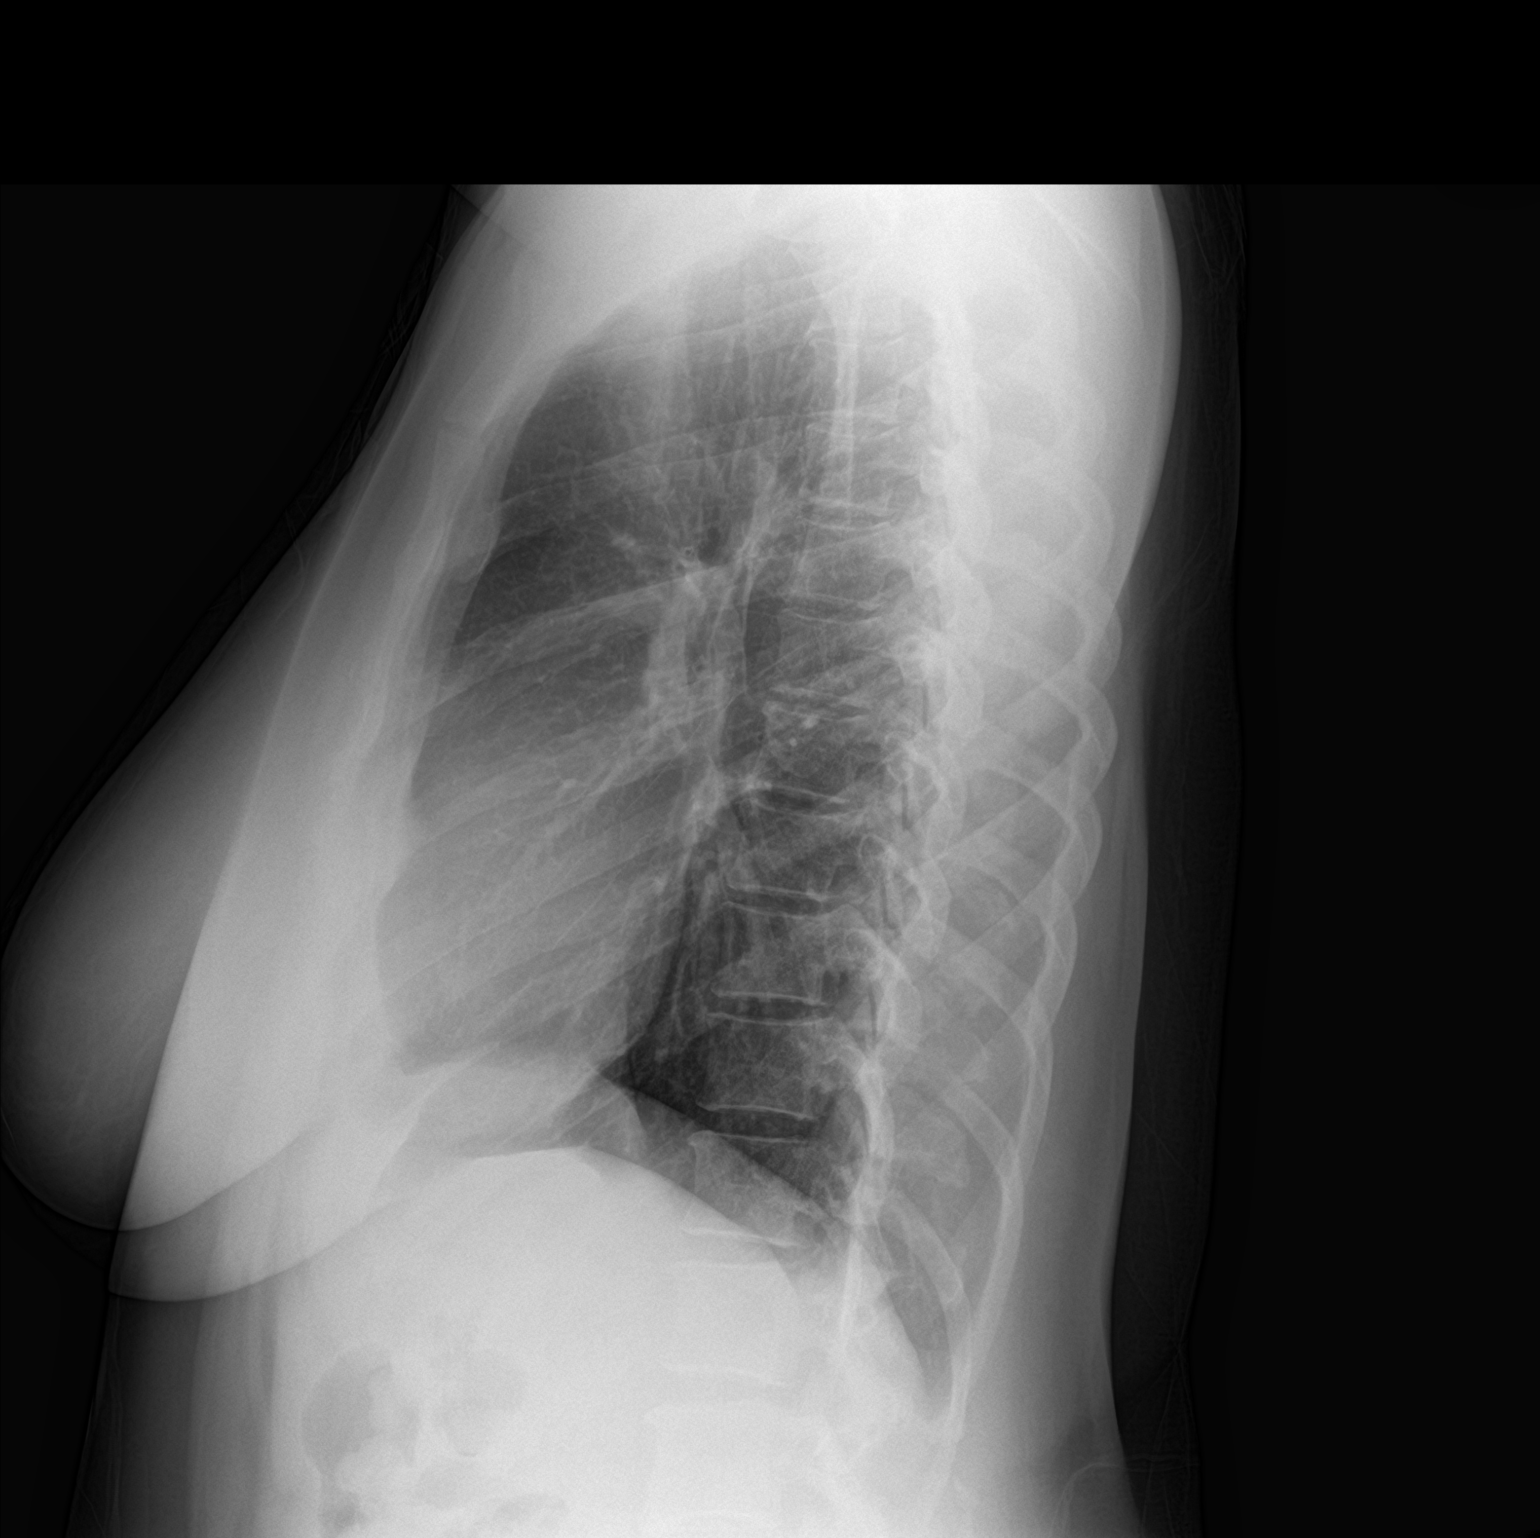

[2 of 2 positions shown; findings below may reference images not displayed]

FINDINGS: Normal heart size, mediastinal contours, and pulmonary vascularity.

Lungs clear.

No pleural effusion or pneumothorax.

Bones unremarkable.
IMPRESSION: Normal exam.

## 2022-07-22 ENCOUNTER — Emergency Department (HOSPITAL_COMMUNITY)
Admission: EM | Admit: 2022-07-22 | Discharge: 2022-07-22 | Disposition: A | Discharge status: Home / Self Care | Payer: BC Managed Care – PPO | Attending: Emergency Medicine | Admitting: Emergency Medicine

## 2022-07-22 ENCOUNTER — Encounter (HOSPITAL_COMMUNITY): Payer: Self-pay | Admitting: Emergency Medicine

## 2022-07-22 ENCOUNTER — Other Ambulatory Visit: Payer: Self-pay

## 2022-07-22 DIAGNOSIS — R519 Headache, unspecified: Secondary | ICD-10-CM | POA: Insufficient documentation

## 2022-07-22 DIAGNOSIS — R1110 Vomiting, unspecified: Secondary | ICD-10-CM | POA: Diagnosis not present

## 2022-07-22 DIAGNOSIS — H9202 Otalgia, left ear: Secondary | ICD-10-CM

## 2022-07-22 MED ORDER — PSEUDOEPHEDRINE HCL 60 MG PO TABS
60.0000 mg | ORAL_TABLET | Freq: Once | ORAL | Status: AC
  Administered 2022-07-22: 60 mg via ORAL
  Filled 2022-07-22: qty 1

## 2022-07-22 MED ORDER — ONDANSETRON 8 MG PO TBDP
8.0000 mg | ORAL_TABLET | Freq: Once | ORAL | Status: AC
  Administered 2022-07-22: 8 mg via ORAL
  Filled 2022-07-22: qty 1

## 2022-07-22 MED ORDER — IBUPROFEN 800 MG PO TABS
800.0000 mg | ORAL_TABLET | Freq: Once | ORAL | Status: AC
  Administered 2022-07-22: 800 mg via ORAL
  Filled 2022-07-22: qty 1

## 2022-07-22 MED ORDER — IBUPROFEN 800 MG PO TABS: 800.0000 mg | ORAL_TABLET | Freq: Four times a day (QID) | ORAL | 0 refills | Status: DC | PRN

## 2022-07-22 MED ORDER — ONDANSETRON 4 MG PO TBDP: ORAL_TABLET | ORAL | 0 refills | Status: DC

## 2022-07-22 MED ORDER — PREDNISONE 20 MG PO TABS: 40.0000 mg | ORAL_TABLET | Freq: Every day | ORAL | 0 refills | Status: DC

## 2022-07-22 MED ORDER — PREDNISONE 50 MG PO TABS
60.0000 mg | ORAL_TABLET | Freq: Once | ORAL | Status: AC
  Administered 2022-07-22: 60 mg via ORAL
  Filled 2022-07-22: qty 1

## 2022-07-22 MED ORDER — CLARITIN-D 24 HOUR 10-240 MG PO TB24: 1.0000 | ORAL_TABLET | Freq: Every day | ORAL | 0 refills | Status: DC

## 2022-07-22 NOTE — ED Provider Notes (Signed)
[facility]PART[facility] AT [facility] Provider Note   CSN: [id number] Arrival date & time: 07/22/22  4098     History  Chief Complaint  Patient presents with   Otalgia   Headache   Emesis    Courtney Shields is a 52 y.o. female.  Presents to the emergency department with several days of left earache.  Patient reports that this is causing a headache and she did vomit this morning.  No hearing loss.  No ear drainage.       Home Medications Prior to Admission medications   Medication Sig Start Date End Date Taking? Authorizing Provider  ibuprofen (ADVIL) 800 MG tablet Take 1 tablet (800 mg total) by mouth every 6 (six) hours as needed for moderate pain. 07/22/22  Yes Phuong Moffatt, Canary Brim, MD  loratadine-pseudoephedrine (CLARITIN-D 24 HOUR) 10-240 MG 24 hr tablet Take 1 tablet by mouth daily. 07/22/22  Yes Gilda Crease, MD  ondansetron (ZOFRAN-ODT) 4 MG disintegrating tablet 4mg  ODT q4 hours prn nausea/vomit 07/22/22  Yes Zoe Creasman, Canary Brim, MD  predniSONE (DELTASONE) 20 MG tablet Take 2 tablets (40 mg total) by mouth daily with breakfast. 07/22/22  Yes Adrie Picking, Canary Brim, MD      Allergies    Bee venom    Review of Systems   Review of Systems  Physical Exam Updated Vital Signs BP (!) 171/81 (BP Location: Left Arm)   Pulse 70   Temp 98.3 F (36.8 C) (Oral)   Resp 16   Ht 5\' 9"  (1.753 m)   Wt 92 kg   SpO2 98%   BMI 29.95 kg/m  Physical Exam Vitals and nursing note reviewed.  Constitutional:      General: She is not in acute distress.    Appearance: She is well-developed.  HENT:     Head: Normocephalic and atraumatic.     Mouth/Throat:     Mouth: Mucous membranes are moist.  Eyes:     General: Vision grossly intact. Gaze aligned appropriately.     Extraocular Movements: Extraocular movements intact.     Conjunctiva/sclera: Conjunctivae normal.  Cardiovascular:     Rate and Rhythm: Normal rate and regular rhythm.      Pulses: Normal pulses.     Heart sounds: Normal heart sounds, S1 normal and S2 normal. No murmur heard.    No friction rub. No gallop.  Pulmonary:     Effort: Pulmonary effort is normal. No respiratory distress.     Breath sounds: Normal breath sounds.  Abdominal:     General: Bowel sounds are normal.     Palpations: Abdomen is soft.     Tenderness: There is no abdominal tenderness. There is no guarding or rebound.     Hernia: No hernia is present.  Musculoskeletal:        General: No swelling.     Cervical back: Full passive range of motion without pain, normal range of motion and neck supple. No spinous process tenderness or muscular tenderness. Normal range of motion.     Right lower leg: No edema.     Left lower leg: No edema.  Skin:    General: Skin is warm and dry.     Capillary Refill: Capillary refill takes less than 2 seconds.     Findings: No ecchymosis, erythema, rash or wound.  Neurological:     General: No focal deficit present.     Mental Status: She is alert and oriented to person, place, and time.  GCS: GCS eye subscore is 4. GCS verbal subscore is 5. GCS motor subscore is 6.     Cranial Nerves: Cranial nerves 2-12 are intact.     Sensory: Sensation is intact.     Motor: Motor function is intact.     Coordination: Coordination is intact.  Psychiatric:        Attention and Perception: Attention normal.        Mood and Affect: Mood normal.        Speech: Speech normal.        Behavior: Behavior normal.     ED Results / Procedures / Treatments   Labs (all labs ordered are listed, but only abnormal results are displayed) Labs Reviewed - No data to display  EKG None  Radiology No results found.  Procedures Procedures    Medications Ordered in ED Medications  ondansetron (ZOFRAN-ODT) disintegrating tablet 8 mg (has no administration in time range)  predniSONE (DELTASONE) tablet 60 mg (has no administration in time range)  ibuprofen (ADVIL) tablet  800 mg (has no administration in time range)  pseudoephedrine (SUDAFED) tablet 60 mg (has no administration in time range)    ED Course/ Medical Decision Making/ A&P                             Medical Decision Making  Differential diagnosis considered includes, but not limited to: Otitis media; otitis externa; eustachian tube dysfunction; pharyngitis  Patient with left ear pain.  Area is tender to the touch.  She reports that it hurts more when she swallows.  Oropharyngeal examination is negative, no erythema, exudate or signs of peritonsillar abscess or strep throat.  With the pain increasing with swallowing, suspect that this is related to eustachian tube.  Will treat symptomatically.  Patient appears well, no neurologic findings on exam to suggest intracranial pathology.        Final Clinical Impression(s) / ED Diagnoses Final diagnoses:  Earache on left    Rx / DC Orders ED Discharge Orders          Ordered    loratadine-pseudoephedrine (CLARITIN-D 24 HOUR) 10-240 MG 24 hr tablet  Daily        07/22/22 0625    predniSONE (DELTASONE) 20 MG tablet  Daily with breakfast        07/22/22 0625    ibuprofen (ADVIL) 800 MG tablet  Every 6 hours PRN        07/22/22 0625    ondansetron (ZOFRAN-ODT) 4 MG disintegrating tablet        07/22/22 0625              Gilda Crease, MD 07/22/22 [phone]

## 2022-07-22 NOTE — ED Triage Notes (Signed)
Pt with c/o L earache since Thurs, a headache "since the 26th", and emesis x 3 this am.

## 2023-04-18 ENCOUNTER — Encounter (HOSPITAL_COMMUNITY): Payer: Self-pay

## 2023-04-18 ENCOUNTER — Emergency Department (HOSPITAL_COMMUNITY): Admission: EM | Admit: 2023-04-18 | Discharge: 2023-04-18 | Disposition: A | Discharge status: Home / Self Care

## 2023-04-18 ENCOUNTER — Other Ambulatory Visit: Payer: Self-pay

## 2023-04-18 ENCOUNTER — Emergency Department (HOSPITAL_COMMUNITY)

## 2023-04-18 DIAGNOSIS — Z7982 Long term (current) use of aspirin: Secondary | ICD-10-CM | POA: Insufficient documentation

## 2023-04-18 DIAGNOSIS — R519 Headache, unspecified: Secondary | ICD-10-CM | POA: Diagnosis present

## 2023-04-18 DIAGNOSIS — I10 Essential (primary) hypertension: Secondary | ICD-10-CM | POA: Diagnosis not present

## 2023-04-18 DIAGNOSIS — Z79899 Other long term (current) drug therapy: Secondary | ICD-10-CM | POA: Diagnosis not present

## 2023-04-18 DIAGNOSIS — M79602 Pain in left arm: Secondary | ICD-10-CM | POA: Insufficient documentation

## 2023-04-18 LAB — CBC WITH DIFFERENTIAL/PLATELET
Abs Immature Granulocytes: 0.02 10*3/uL (ref 0.00–0.07)
Basophils Absolute: 0 10*3/uL (ref 0.0–0.1)
Basophils Relative: 1 %
Eosinophils Absolute: 0.1 10*3/uL (ref 0.0–0.5)
Eosinophils Relative: 2 %
HCT: 32.7 % — ABNORMAL LOW (ref 36.0–46.0)
Hemoglobin: 9.5 g/dL — ABNORMAL LOW (ref 12.0–15.0)
Immature Granulocytes: 0 %
Lymphocytes Relative: 30 %
Lymphs Abs: 1.5 10*3/uL (ref 0.7–4.0)
MCH: 24.1 pg — ABNORMAL LOW (ref 26.0–34.0)
MCHC: 29.1 g/dL — ABNORMAL LOW (ref 30.0–36.0)
MCV: 82.8 fL (ref 80.0–100.0)
Monocytes Absolute: 0.4 10*3/uL (ref 0.1–1.0)
Monocytes Relative: 8 %
Neutro Abs: 2.8 10*3/uL (ref 1.7–7.7)
Neutrophils Relative %: 59 %
Platelets: 325 10*3/uL (ref 150–400)
RBC: 3.95 MIL/uL (ref 3.87–5.11)
RDW: 17.2 % — ABNORMAL HIGH (ref 11.5–15.5)
WBC: 4.9 10*3/uL (ref 4.0–10.5)
nRBC: 0 % (ref 0.0–0.2)

## 2023-04-18 LAB — BASIC METABOLIC PANEL
Anion gap: 10 (ref 5–15)
BUN: 11 mg/dL (ref 6–20)
CO2: 23 mmol/L (ref 22–32)
Calcium: 8.9 mg/dL (ref 8.9–10.3)
Chloride: 103 mmol/L (ref 98–111)
Creatinine, Ser: 0.92 mg/dL (ref 0.44–1.00)
GFR, Estimated: >60 mL/min (ref >60)
Glucose, Bld: 103 mg/dL — ABNORMAL HIGH (ref 70–99)
Potassium: 4.7 mmol/L (ref 3.5–5.1)
Sodium: 136 mmol/L (ref 135–145)

## 2023-04-18 LAB — TROPONIN I (HIGH SENSITIVITY): Troponin I (High Sensitivity): 3 ng/L (ref <18)

## 2023-04-18 LAB — PROTIME-INR
INR: 1 (ref 0.8–1.2)
Prothrombin Time: 13.3 s (ref 11.4–15.2)

## 2023-04-18 MED ORDER — AMLODIPINE BESYLATE 5 MG PO TABS: 5.0000 mg | ORAL_TABLET | Freq: Every day | ORAL | 0 refills | Status: DC

## 2023-04-18 MED ORDER — KETOROLAC TROMETHAMINE 15 MG/ML IJ SOLN
15.0000 mg | Freq: Once | INTRAMUSCULAR | Status: AC
  Administered 2023-04-18: 15 mg via INTRAMUSCULAR

## 2023-04-18 MED ORDER — AMLODIPINE BESYLATE 5 MG PO TABS
5.0000 mg | ORAL_TABLET | Freq: Once | ORAL | Status: AC
  Administered 2023-04-18: 5 mg via ORAL
  Filled 2023-04-18: qty 1

## 2023-04-18 MED ORDER — LABETALOL HCL 5 MG/ML IV SOLN
10.0000 mg | Freq: Once | INTRAVENOUS | Status: DC
  Filled 2023-04-18: qty 4

## 2023-04-18 MED ORDER — KETOROLAC TROMETHAMINE 15 MG/ML IJ SOLN
15.0000 mg | Freq: Once | INTRAMUSCULAR | Status: DC
  Filled 2023-04-18: qty 1

## 2023-04-18 NOTE — ED Provider Notes (Signed)
 [place] EMERGENCY DEPARTMENT AT [facility] Provider Note   CSN: [id number] Arrival date & time: 04/18/23  1356     History  Chief Complaint  Patient presents with   Numbness    Courtney Shields is a 53 y.o. female.  53 year old female with no reported past medical history presenting to the emergency department today with headache and left arm pain.  The patient states that this began after lunch today.  She states that she had her coworker checked her blood pressure and it was elevated.  She was told to come to the ER for further evaluation regarding this.  The patient denies any associated chest pain or shortness of breath.  She states that she was having some pressure over her sinuses.  States this is not totally out of the ordinary for her and she does occasionally have headaches.  She states that this was moderate in intensity and that without the pain in her arm and elevated blood pressure that she would not of sought treatment for this.  She denies any numbness or tingling but states she is having more pain in the left arm.  She states that the symptoms have improved some since she has arrived to the emergency department.  She states that she has had elevated blood pressure in the past but usually this has improved so she has not ever been started on any medication.        Home Medications Prior to Admission medications   Medication Sig Start Date End Date Taking? Authorizing Provider  acetaminophen (TYLENOL) 500 MG tablet Take 500 mg by mouth every 6 (six) hours as needed for moderate pain (pain score 4-6).   Yes [provider]  amLODipine (NORVASC) 5 MG tablet Take 1 tablet (5 mg total) by mouth daily. 04/18/23  Yes Durwin Glaze, MD  aspirin EC 81 MG tablet Take 81 mg by mouth as needed for moderate pain (pain score 4-6). Swallow whole.   Yes [provider]  diphenhydrAMINE (BENADRYL) 25 MG tablet Take 25 mg by mouth every 6 (six) hours as  needed for allergies.   Yes [provider]      Allergies    Bee venom    Review of Systems   Review of Systems  Musculoskeletal:        Left arm pain  Neurological:  Positive for headaches.  All other systems reviewed and are negative.   Physical Exam Updated Vital Signs BP (!) 161/61   Pulse 67   Temp 98.9 F (37.2 C) (Oral)   Resp (!) 23   Ht 5\' 9"  (1.753 m)   Wt 92 kg   SpO2 100%   BMI 29.95 kg/m  Physical Exam Vitals and nursing note reviewed.   Gen: NAD Eyes: PERRL, EOMI HEENT: no oropharyngeal swelling Neck: trachea midline Resp: clear to auscultation bilaterally Card: RRR, no murmurs, rubs, or gallops Abd: nontender, nondistended Extremities: no calf tenderness, no edema Vascular: 2+ radial pulses bilaterally, 2+ DP pulses bilaterally Neuro: Cranial nerves intact, equal strength and sensation throughout bilateral upper and lower extremities with no dysmetria on finger-to-nose testing Skin: no rashes Psyc: acting appropriately   ED Results / Procedures / Treatments   Labs (all labs ordered are listed, but only abnormal results are displayed) Labs Reviewed  BASIC METABOLIC PANEL - Abnormal; Notable for the following components:      Result Value   Glucose, Bld 103 (*)    All other components within  normal limits  CBC WITH DIFFERENTIAL/PLATELET - Abnormal; Notable for the following components:   Hemoglobin 9.5 (*)    HCT 32.7 (*)    MCH 24.1 (*)    MCHC 29.1 (*)    RDW 17.2 (*)    All other components within normal limits  PROTIME-INR  TROPONIN I (HIGH SENSITIVITY)  TROPONIN I (HIGH SENSITIVITY)    EKG None  Radiology CT Head Wo Contrast Result Date: 04/18/2023 CLINICAL DATA:  Left arm numbness.  Frontal headache. EXAM: CT HEAD WITHOUT CONTRAST TECHNIQUE: Contiguous axial images were obtained from the base of the skull through the vertex without intravenous contrast. RADIATION DOSE REDUCTION: This exam was performed according to the  departmental dose-optimization program which includes automated exposure control, adjustment of the mA and/or kV according to patient size and/or use of iterative reconstruction technique. COMPARISON:  None Available. FINDINGS: Brain: No evidence of acute infarction, hemorrhage, hydrocephalus, extra-axial collection or mass lesion/mass effect. Vascular: No hyperdense vessel or unexpected calcification. Skull: Normal. Negative for fracture or focal lesion. Sinuses/Orbits: No acute finding. Other: None. IMPRESSION: No acute intracranial abnormality. Electronically Signed   By: Darliss Cheney M.D.   On: 04/18/2023 17:41    Procedures Procedures    Medications Ordered in ED Medications  amLODipine (NORVASC) tablet 5 mg (5 mg Oral Given 04/18/23 1752)  ketorolac (TORADOL) 15 MG/ML injection 15 mg (15 mg Intramuscular Given 04/18/23 1752)    ED Course/ Medical Decision Making/ A&P                                 Medical Decision Making 53 year old female with no reported past medical history presenting to the emergency department today with left arm pain.  The patient's blood pressure was elevated here on arrival.  This has improved but is still elevated in the 170s to 180s here.  I will further evaluate the patient here with basic labs Wels and EKG, chest x-ray, and troponin for further evaluate for atypical ACS.  She is complaining more pain and has reassuring neurologic exam and denies any weakness or numbness.  Suspicion for CVA is low but given the significantly elevated blood pressure and headache will obtain a CT scan to eval for intracranial hemorrhage or mass lesion.  Suspicion for subarachnoid hemorrhage is low given the description of her symptoms.  I will give the patient a dose of labetalol here for her blood pressure.  Will reevaluate for ultimate disposition.  The patient's labs here are reassuring.  There is no signs of endorgan dysfunction.  CT scan of her head is negative.  She remains  well-appearing with stable vital signs.  Blood pressure has improved after amlodipine.  She is discharged with return precautions.  Risk Prescription drug management.          Final Clinical Impression(s) / ED Diagnoses Final diagnoses:  Uncontrolled hypertension    Rx / DC Orders ED Discharge Orders          Ordered    amLODipine (NORVASC) 5 MG tablet  Daily        04/18/23 1900    Referral to [facility] Care Management       Comments: Pharmacy Medication Management for Uncontrolled hypertension in the ED   04/18/23 1900              Durwin Glaze, MD 04/18/23 1901

## 2023-04-18 NOTE — Discharge Instructions (Addendum)
 Your workup today was reassuring.  Please take the amlodipine as prescribed and follow-up to have your blood pressure rechecked.  I have placed a consult so you should receive a call in the next few days regarding instructions on follow-up.  Thank you for the opportunity to take care of you in our Emergency Department. You have been diagnosed with high blood pressure, also known as hypertension. This means that the force of blood against the walls of your blood vessels called is too strong. It also means that your heart has to work harder to move the blood. High blood pressure usually has no symptoms, but over time, it can cause serious health problems such as Heart attack and heart failure Stroke Kidney disease and failure Vision loss With the help from your healthcare provider and some important life style changes, you can manage your blood pressure and protect your health. Please read the instructions provided on hypertension, how to manage it and how to check your blood pressure. Additionally, use the blood pressure log provided to record your blood pressures. Take the blood pressure log with you to your primary care doctor so that they can adjust your blood pressure medications if needed. Please read the instructions on follow-up appointment. Return to the ER or Call 911 right away if you have any of these symptoms: Chest pain or shortness of breath Severe headache Weakness, tingling, or numbness of your face, arms, or legs (especially on 1 side of the body) Sudden change in vision Confusion, trouble speaking, or trouble understanding speech

## 2023-04-18 NOTE — ED Provider Triage Note (Signed)
 Emergency Medicine Provider Triage Evaluation Note  Courtney Shields , a 53 y.o. female  was evaluated in triage.  Pt complains of sudden onset of pain to her left upper shoulder area and left forearm area.  She also had pain of her anterior left calf.  She was at work when the symptoms began.  She endorses having some frontal headache.  No visual changes or dizziness.  She denies any numbness.  States that she had her blood pressure checked several times and her pressures were elevated, near over 200s systolic.  No history of hypertension.  She denies any new medications, facial weakness, extremity weakness or speech changes.  Review of Systems  Positive: Left lower leg left shoulder and left forearm pain, headache Negative: Numbness or weakness of the face or extremities, speech change  Physical Exam  BP (!) 206/86 (BP Location: Right Arm)   Pulse 74   Temp 98.9 F (37.2 C) (Oral)   Resp 18   Ht 5\' 9"  (1.753 m)   Wt 92 kg   SpO2 97%   BMI 29.95 kg/m  Gen:   Awake, no distress   Resp:  Normal effort  MSK:   Moves extremities without difficulty    Medical Decision Making  Medically screening exam initiated at 3:19 PM.  Appropriate orders placed.  Courtney Shields was informed that the remainder of the evaluation will be completed by another provider, this initial triage assessment does not replace that evaluation, and the importance of remaining in the ED until their evaluation is complete.     Pauline Aus, PA-C 04/18/23 1523

## 2023-04-18 NOTE — ED Triage Notes (Signed)
 Pt c/o left arm numbness down to fingers and gradually going down left leg. Pt states frontal headache, denies dizziness. Pt 206/86 during triage. Pt states she took an aspirin during lunch when this started. EDP at bedside during triage.

## 2023-04-22 ENCOUNTER — Telehealth: Payer: Self-pay

## 2023-04-22 NOTE — Progress Notes (Signed)
 Care Guide Pharmacy Note  04/22/2023 Name: DAWNNA GRITZ MRN: [id number] DOB: 1970/09/10  Referred By: Patient, No Pcp Per Reason for referral: Care Coordination (Outreach to schedule with Pharm d )   LALONI ROWTON is a 53 y.o. year old female who is a primary care patient of Patient, No Pcp Per.  Kassadi H Pinela was referred to the pharmacist for assistance related to: HTN  An unsuccessful telephone outreach was attempted today to contact the patient who was referred to the pharmacy team for assistance with medication management. Additional attempts will be made to contact the patient.  Penne Lash , RMA     [facility]  [facility], [facility] Guide  Direct Dial: [phone]  Website: [email or web address]

## 2023-04-29 NOTE — Progress Notes (Signed)
 Care Guide Pharmacy Note  04/29/2023 Name: Courtney Shields MRN: [id number] DOB: Feb 12, 1970  Referred By: Patient, No Pcp Per Reason for referral: Care Coordination (Outreach to schedule with Pharm d )   Courtney Shields is a 53 y.o. year old female who is a primary care patient of Patient, No Pcp Per.  Chalice H Ikard was referred to the pharmacist for assistance related to: HTN  A second unsuccessful telephone outreach was attempted today to contact the patient who was referred to the pharmacy team for assistance with medication management. Additional attempts will be made to contact the patient.  Penne Lash , RMA     [facility]  [facility], [facility] Guide  Direct Dial: [phone]  Website: [email or web address]

## 2023-05-06 NOTE — Progress Notes (Signed)
 Care Guide Pharmacy Note  05/06/2023 Name: Courtney Shields MRN: [id number] DOB: [birth year]  Referred By: Patient, No Pcp Per Reason for referral: Care Coordination (Outreach to schedule with Pharm d )   [name] is a 53 y.o. year old female who is a primary care patient of Patient, No Pcp Per.  Mirage H Nish was referred to the pharmacist for assistance related to: HTN  Successful contact was made with the patient to discuss pharmacy services.  Patient declines engagement at this time. Contact information was provided to the patient should they wish to reach out for assistance at a later time.  Penne Lash , RMA     [facility]  [facility], [facility] Guide  Direct Dial: [phone]  Website: [email or web address]

## 2023-06-04 ENCOUNTER — Ambulatory Visit: Payer: Self-pay

## 2023-06-04 VITALS — BP 170/85 | HR 76 | Ht 69.0 in | Wt 216.1 lb

## 2023-06-04 DIAGNOSIS — I10 Essential (primary) hypertension: Secondary | ICD-10-CM

## 2023-06-04 DIAGNOSIS — R591 Generalized enlarged lymph nodes: Secondary | ICD-10-CM

## 2023-06-04 MED ORDER — AMLODIPINE BESYLATE 10 MG PO TABS
10.0000 mg | ORAL_TABLET | Freq: Every day | ORAL | 0 refills | Status: DC
Start: 2023-06-04 — End: 2023-09-03

## 2023-06-04 NOTE — Progress Notes (Unsigned)
 New Patient Office Visit  Subjective    Patient ID: Courtney Shields, female    DOB: May 18, 1970  Age: 53 y.o. MRN: [id number]  CC:  Chief Complaint  Patient presents with   Establish Care    Pt here to establish care, blood pressure    HPI Courtney Shields presents to establish care Evaluation of blood pressure and swollen lymph node in neck. Patient was seen in the ER on 04/18/2023 for uncontrolled hypertension.  She was started on amlodipine  5 mg.  She reports being out of this for a few days but has tolerated well   Outpatient Encounter Medications as of 06/04/2023  Medication Sig   amLODipine  (NORVASC ) 10 MG tablet Take 1 tablet (10 mg total) by mouth daily.   diphenhydrAMINE  (BENADRYL ) 25 MG tablet Take 25 mg by mouth every 6 (six) hours as needed for allergies.   [DISCONTINUED] amLODipine  (NORVASC ) 5 MG tablet Take 1 tablet (5 mg total) by mouth daily.   [DISCONTINUED] acetaminophen  (TYLENOL ) 500 MG tablet Take 500 mg by mouth every 6 (six) hours as needed for moderate pain (pain score 4-6). (Patient not taking: Reported on 06/04/2023)   [DISCONTINUED] aspirin EC 81 MG tablet Take 81 mg by mouth as needed for moderate pain (pain score 4-6). Swallow whole. (Patient not taking: Reported on 06/04/2023)   No facility-administered encounter medications on file as of 06/04/2023.    Past Medical History:  Diagnosis Date   Acid reflux    Hypertension     Past Surgical History:  Procedure Laterality Date   CESAREAN SECTION     ORIF HUMERUS FRACTURE Right 06/12/2013   Procedure: OPEN REDUCTION INTERNAL FIXATION (ORIF) RIGHT DISTAL HUMERUS FRACTURE;  Surgeon: Darrin Emerald, MD;  Location: [facility];  Service: Orthopedics;  Laterality: Right;   TUBAL LIGATION      Family History  Problem Relation Age of Onset   Hypertension Mother    Diabetes Father     Social History   Socioeconomic History   Marital status: Divorced    Spouse name: Not on file   Number of  children: Not on file   Years of education: Not on file   Highest education level: Not on file  Occupational History   Not on file  Tobacco Use   Smoking status: Never   Smokeless tobacco: Never  Substance and Sexual Activity   Alcohol use: No   Drug use: No   Sexual activity: Yes    Birth control/protection: Surgical  Other Topics Concern   Not on file  Social History Narrative   Not on file   Social Drivers of Health   Financial Resource Strain: Not on file  Food Insecurity: No Food Insecurity (06/04/2023)   Hunger Vital Sign    Worried About Running Out of Food in the Last Year: Never true    Ran Out of Food in the Last Year: Never true  Transportation Needs: No Transportation Needs (06/04/2023)   PRAPARE - Administrator, Civil Service (Medical): No    Lack of Transportation (Non-Medical): No  Physical Activity: Not on file  Stress: Not on file  Social Connections: Not on file  Intimate Partner Violence: Not At Risk (06/04/2023)   Humiliation, Afraid, Rape, and Kick questionnaire    Fear of Current or Ex-Partner: No    Emotionally Abused: No    Physically Abused: No    Sexually Abused: No    ROS      Objective  BP (!) 170/85   Pulse 76   Ht 5\' 9"  (1.753 m)   Wt 216 lb 1.3 oz (98 kg)   SpO2 97%   BMI 31.91 kg/m   Physical Exam Vitals and nursing note reviewed.  Constitutional:      Appearance: Normal appearance.  HENT:     Head: Normocephalic.     Right Ear: Tympanic membrane, ear canal and external ear normal.     Left Ear: Tympanic membrane, ear canal and external ear normal.     Nose: Nose normal.     Mouth/Throat:     Mouth: Mucous membranes are moist.     Pharynx: Oropharynx is clear.  Neck:     Thyroid: No thyroid mass.   Cardiovascular:     Rate and Rhythm: Normal rate and regular rhythm.  Pulmonary:     Effort: Pulmonary effort is normal.     Breath sounds: Normal breath sounds.  Musculoskeletal:     Cervical back:  Normal range of motion and neck supple.  Lymphadenopathy:     Cervical: Cervical adenopathy present.     Right cervical: Superficial cervical adenopathy (anterior cervical) present.  Skin:    General: Skin is warm and dry.  Neurological:     Mental Status: She is alert and oriented to person, place, and time.  Psychiatric:        Mood and Affect: Mood normal.        Thought Content: Thought content normal.         Assessment & Plan:   Problem List Items Addressed This Visit       Cardiovascular and Mediastinum   Primary hypertension - Primary   BP elevated today but has been out of medicines for few days continue with current dose of amlodipine .  Refill sent to pharmacy.  Discussed need for lifestyle changes including weight loss with a healthy low-sodium diet and regular exercise goal of 150 minutes/week.  Recommend monitoring blood pressure at home or retail setting.  Recommend follow-up in 3 months for recheck.      Relevant Medications   amLODipine  (NORVASC ) 10 MG tablet   Other Visit Diagnoses       Lymphadenopathy of head and neck       Refer to ENT for further evaluation   Relevant Orders   Ambulatory referral to ENT       No follow-ups on file.   Alison Irvine, FNP

## 2023-06-05 DIAGNOSIS — I10 Essential (primary) hypertension: Secondary | ICD-10-CM | POA: Insufficient documentation

## 2023-06-05 NOTE — Assessment & Plan Note (Signed)
 BP elevated today but has been out of medicines for few days continue with current dose of amlodipine .  Refill sent to pharmacy.  Discussed need for lifestyle changes including weight loss with a healthy low-sodium diet and regular exercise goal of 150 minutes/week.  Recommend monitoring blood pressure at home or retail setting.  Recommend follow-up in 3 months for recheck.

## 2023-09-03 ENCOUNTER — Ambulatory Visit

## 2023-09-03 VITALS — BP 137/81 | HR 68 | Ht 67.0 in | Wt 212.0 lb

## 2023-09-03 DIAGNOSIS — Z1231 Encounter for screening mammogram for malignant neoplasm of breast: Secondary | ICD-10-CM

## 2023-09-03 DIAGNOSIS — I10 Essential (primary) hypertension: Secondary | ICD-10-CM | POA: Diagnosis not present

## 2023-09-03 MED ORDER — AMLODIPINE BESYLATE 10 MG PO TABS
10.0000 mg | ORAL_TABLET | Freq: Every day | ORAL | 1 refills | Status: AC
Start: 2023-09-03 — End: ?

## 2023-09-03 NOTE — Patient Instructions (Signed)
 Recommend compression socks during the day for swelling in legs.  Dr. Watson is a good brand available on [employer].

## 2023-09-03 NOTE — Progress Notes (Signed)
 Established Patient Office Visit  Subjective   Patient ID: Courtney Shields, female    DOB: 04/24/1970  Age: 53 y.o. MRN: [id number]  Chief Complaint  Patient presents with   Hypertension    Three month follow up    HPI Hypertension, follow-up  BP Readings from Last 3 Encounters:  09/03/23 137/81  06/04/23 (!) 170/85  04/18/23 (!) 156/68   Wt Readings from Last 3 Encounters:  09/03/23 212 lb (96.2 kg)  06/04/23 216 lb 1.3 oz (98 kg)  04/18/23 202 lb 13.2 oz (92 kg)     She was last seen for hypertension 4 months ago.  BP at that visit was 170/85. Management since that visit includes medication.  She reports excellent compliance with treatment. She is not having side effects.  She is following a Low Sodium diet. She is exercising. She does not smoke.  Use of agents associated with hypertension: none.   Outside blood pressures are n/a. Symptoms: No chest pain No chest pressure  No palpitations No syncope  No dyspnea No orthopnea  No paroxysmal nocturnal dyspnea No lower extremity edema   Pertinent labs No results found for: CHOL, HDL, LDLCALC, LDLDIRECT, TRIG, CHOLHDL Lab Results  Component Value Date   NA 136 04/18/2023   K 4.7 04/18/2023   CREATININE 0.92 04/18/2023   GFRNONAA >60 04/18/2023   GLUCOSE 103 (H) 04/18/2023     The ASCVD Risk score (Arnett DK, et al., 2019) failed to calculate for the following reasons:   Cannot find a previous HDL lab   Cannot find a previous total cholesterol lab  ---------------------------------------------------------------------------------------------------    ROS    Objective:     BP 137/81   Pulse 68   Ht 5' 7 (1.702 m)   Wt 212 lb (96.2 kg)   SpO2 97%   BMI 33.20 kg/m  BP Readings from Last 3 Encounters:  09/03/23 137/81  06/04/23 (!) 170/85  04/18/23 (!) 156/68   Wt Readings from Last 3 Encounters:  09/03/23 212 lb (96.2 kg)  06/04/23 216 lb 1.3 oz (98 kg)  04/18/23 202 lb 13.2  oz (92 kg)      Physical Exam Vitals and nursing note reviewed.  Constitutional:      Appearance: Normal appearance.  HENT:     Head: Normocephalic.  Eyes:     Extraocular Movements: Extraocular movements intact.     Pupils: Pupils are equal, round, and reactive to light.  Cardiovascular:     Rate and Rhythm: Normal rate and regular rhythm.  Pulmonary:     Effort: Pulmonary effort is normal.     Breath sounds: Normal breath sounds.  Musculoskeletal:     Cervical back: Normal range of motion and neck supple.  Neurological:     Mental Status: She is alert and oriented to person, place, and time.  Psychiatric:        Mood and Affect: Mood normal.        Thought Content: Thought content normal.      No results found for any visits on 09/03/23.    The ASCVD Risk score (Arnett DK, et al., 2019) failed to calculate for the following reasons:   Cannot find a previous HDL lab   Cannot find a previous total cholesterol lab    Assessment & Plan:   Problem List Items Addressed This Visit       Cardiovascular and Mediastinum   Primary hypertension   Blood pressure is much improved with amlodipine  10  mg.  No changes in medication made today.  Refills provided.  Recommend follow-up in 3 to 6 months or sooner if BP remains above 140/90.      Relevant Medications   amLODipine  (NORVASC ) 10 MG tablet   Other Visit Diagnoses       Encounter for screening mammogram for malignant neoplasm of breast    -  Primary   Relevant Orders   MM 3D SCREENING MAMMOGRAM BILATERAL BREAST       Return in about 6 months (around 03/05/2024) for chronic follow-up with PCP.    Leita Longs, FNP

## 2023-09-09 NOTE — Assessment & Plan Note (Signed)
 Blood pressure is much improved with amlodipine  10 mg.  No changes in medication made today.  Refills provided.  Recommend follow-up in 3 to 6 months or sooner if BP remains above 140/90.

## 2023-10-03 ENCOUNTER — Ambulatory Visit (HOSPITAL_COMMUNITY)
Admission: RE | Admit: 2023-10-03 | Discharge: 2023-10-03 | Disposition: A | Discharge status: Home / Self Care | Source: Ambulatory Visit

## 2023-10-03 DIAGNOSIS — Z1231 Encounter for screening mammogram for malignant neoplasm of breast: Secondary | ICD-10-CM | POA: Insufficient documentation

## 2023-10-09 ENCOUNTER — Other Ambulatory Visit (HOSPITAL_COMMUNITY): Payer: Self-pay

## 2023-10-09 DIAGNOSIS — R928 Other abnormal and inconclusive findings on diagnostic imaging of breast: Secondary | ICD-10-CM

## 2023-10-16 ENCOUNTER — Ambulatory Visit: Payer: Self-pay

## 2023-10-17 ENCOUNTER — Encounter (HOSPITAL_COMMUNITY): Payer: Self-pay

## 2023-10-17 ENCOUNTER — Ambulatory Visit (HOSPITAL_COMMUNITY)
Admission: RE | Admit: 2023-10-17 | Discharge: 2023-10-17 | Disposition: A | Discharge status: Home / Self Care | Source: Ambulatory Visit

## 2023-10-17 DIAGNOSIS — R928 Other abnormal and inconclusive findings on diagnostic imaging of breast: Secondary | ICD-10-CM | POA: Diagnosis present

## 2023-11-12 ENCOUNTER — Ambulatory Visit: Payer: Self-pay

## 2024-03-04 ENCOUNTER — Ambulatory Visit

## 2024-03-16 ENCOUNTER — Ambulatory Visit: Payer: Self-pay

## 2024-04-23 ENCOUNTER — Ambulatory Visit: Payer: Self-pay
